# Patient Record
Sex: Female | Born: 1975 | Race: White | Hispanic: No | Marital: Married | Smoking: Never smoker
Health system: Southern US, Community
[De-identification: ages and names within clinical notes are randomized; demographics above are authoritative.]

## PROBLEM LIST (undated history)

## (undated) DIAGNOSIS — M3500 Sicca syndrome, unspecified: Secondary | ICD-10-CM

## (undated) DIAGNOSIS — M329 Systemic lupus erythematosus, unspecified: Secondary | ICD-10-CM

## (undated) DIAGNOSIS — G40109 Localization-related (focal) (partial) symptomatic epilepsy and epileptic syndromes with simple partial seizures, not intractable, without status epilepticus: Secondary | ICD-10-CM

## (undated) DIAGNOSIS — H04129 Dry eye syndrome of unspecified lacrimal gland: Secondary | ICD-10-CM

## (undated) DIAGNOSIS — F419 Anxiety disorder, unspecified: Secondary | ICD-10-CM

## (undated) DIAGNOSIS — M948X9 Other specified disorders of cartilage, unspecified sites: Secondary | ICD-10-CM

## (undated) DIAGNOSIS — IMO0002 Reserved for concepts with insufficient information to code with codable children: Secondary | ICD-10-CM

## (undated) HISTORY — DX: Dry eye syndrome of unspecified lacrimal gland: H04.129

## (undated) HISTORY — DX: Sjogren syndrome, unspecified: M35.00

## (undated) HISTORY — DX: Anxiety disorder, unspecified: F41.9

## (undated) HISTORY — DX: Other specified disorders of cartilage, unspecified sites: M94.8X9

## (undated) HISTORY — DX: Systemic lupus erythematosus, unspecified: M32.9

## (undated) HISTORY — DX: Localization-related (focal) (partial) symptomatic epilepsy and epileptic syndromes with simple partial seizures, not intractable, without status epilepticus: G40.109

## (undated) HISTORY — DX: Reserved for concepts with insufficient information to code with codable children: IMO0002

---
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
## 1999-02-10 ENCOUNTER — Other Ambulatory Visit: Admission: RE | Admit: 1999-02-10 | Discharge: 1999-02-10 | Payer: Self-pay | Admitting: Obstetrics and Gynecology

## 2000-03-11 ENCOUNTER — Other Ambulatory Visit: Admission: RE | Admit: 2000-03-11 | Discharge: 2000-03-11 | Payer: Self-pay | Admitting: Gynecology

## 2000-07-06 ENCOUNTER — Encounter: Payer: Self-pay | Admitting: Family Medicine

## 2000-07-06 ENCOUNTER — Encounter: Admission: RE | Admit: 2000-07-06 | Discharge: 2000-07-06 | Payer: Self-pay | Admitting: Family Medicine

## 2001-02-02 ENCOUNTER — Encounter: Payer: Self-pay | Admitting: Orthodontics and Dentofacial Orthopedics

## 2001-02-02 ENCOUNTER — Ambulatory Visit (HOSPITAL_COMMUNITY)
Admission: RE | Admit: 2001-02-02 | Discharge: 2001-02-02 | Payer: Self-pay | Admitting: Orthodontics and Dentofacial Orthopedics

## 2001-04-01 ENCOUNTER — Other Ambulatory Visit: Admission: RE | Admit: 2001-04-01 | Discharge: 2001-04-01 | Payer: Self-pay | Admitting: Gynecology

## 2001-12-12 ENCOUNTER — Emergency Department (HOSPITAL_COMMUNITY): Admission: EM | Admit: 2001-12-12 | Discharge: 2001-12-12 | Payer: Self-pay | Admitting: Emergency Medicine

## 2002-09-08 ENCOUNTER — Other Ambulatory Visit: Admission: RE | Admit: 2002-09-08 | Discharge: 2002-09-08 | Payer: Self-pay | Admitting: Gynecology

## 2002-12-13 ENCOUNTER — Encounter: Admission: RE | Admit: 2002-12-13 | Discharge: 2002-12-13 | Payer: Self-pay | Admitting: Gynecology

## 2002-12-13 ENCOUNTER — Encounter: Payer: Self-pay | Admitting: Gynecology

## 2003-01-27 ENCOUNTER — Inpatient Hospital Stay (HOSPITAL_COMMUNITY): Admission: AD | Admit: 2003-01-27 | Discharge: 2003-01-27 | Payer: Self-pay | Admitting: Gynecology

## 2003-01-27 ENCOUNTER — Encounter: Payer: Self-pay | Admitting: Gynecology

## 2003-03-28 ENCOUNTER — Inpatient Hospital Stay (HOSPITAL_COMMUNITY): Admission: AD | Admit: 2003-03-28 | Discharge: 2003-03-31 | Payer: Self-pay | Admitting: Gynecology

## 2003-05-14 ENCOUNTER — Other Ambulatory Visit: Admission: RE | Admit: 2003-05-14 | Discharge: 2003-05-14 | Payer: Self-pay | Admitting: Gynecology

## 2004-03-27 ENCOUNTER — Ambulatory Visit: Payer: Self-pay | Admitting: Family Medicine

## 2004-06-06 ENCOUNTER — Other Ambulatory Visit: Admission: RE | Admit: 2004-06-06 | Discharge: 2004-06-06 | Payer: Self-pay | Admitting: Obstetrics and Gynecology

## 2004-09-15 ENCOUNTER — Encounter: Admission: RE | Admit: 2004-09-15 | Discharge: 2004-09-15 | Payer: Self-pay | Admitting: Family Medicine

## 2004-09-15 ENCOUNTER — Ambulatory Visit: Payer: Self-pay | Admitting: Family Medicine

## 2004-10-29 ENCOUNTER — Ambulatory Visit: Payer: Self-pay | Admitting: Family Medicine

## 2005-06-08 ENCOUNTER — Other Ambulatory Visit: Admission: RE | Admit: 2005-06-08 | Discharge: 2005-06-08 | Payer: Self-pay | Admitting: Obstetrics and Gynecology

## 2005-12-04 ENCOUNTER — Ambulatory Visit: Payer: Self-pay | Admitting: Family Medicine

## 2005-12-17 ENCOUNTER — Ambulatory Visit: Payer: Self-pay | Admitting: Family Medicine

## 2005-12-23 ENCOUNTER — Ambulatory Visit: Payer: Self-pay | Admitting: Gastroenterology

## 2006-02-01 ENCOUNTER — Ambulatory Visit: Payer: Self-pay | Admitting: Gastroenterology

## 2006-02-08 ENCOUNTER — Ambulatory Visit: Payer: Self-pay | Admitting: Gastroenterology

## 2006-05-28 ENCOUNTER — Ambulatory Visit: Payer: Self-pay | Admitting: Family Medicine

## 2006-06-22 ENCOUNTER — Ambulatory Visit: Payer: Self-pay | Admitting: Internal Medicine

## 2006-06-22 ENCOUNTER — Inpatient Hospital Stay (HOSPITAL_COMMUNITY): Admission: AD | Admit: 2006-06-22 | Discharge: 2006-06-24 | Payer: Self-pay | Admitting: Otolaryngology

## 2006-08-02 ENCOUNTER — Ambulatory Visit (HOSPITAL_COMMUNITY): Admission: RE | Admit: 2006-08-02 | Discharge: 2006-08-02 | Payer: Self-pay | Admitting: Obstetrics and Gynecology

## 2006-08-30 ENCOUNTER — Ambulatory Visit (HOSPITAL_COMMUNITY): Admission: RE | Admit: 2006-08-30 | Discharge: 2006-08-30 | Payer: Self-pay | Admitting: Obstetrics and Gynecology

## 2006-09-29 ENCOUNTER — Ambulatory Visit (HOSPITAL_COMMUNITY): Admission: RE | Admit: 2006-09-29 | Discharge: 2006-09-29 | Payer: Self-pay | Admitting: Obstetrics and Gynecology

## 2006-10-28 ENCOUNTER — Ambulatory Visit (HOSPITAL_COMMUNITY): Admission: RE | Admit: 2006-10-28 | Discharge: 2006-10-28 | Payer: Self-pay | Admitting: Obstetrics and Gynecology

## 2006-11-23 ENCOUNTER — Ambulatory Visit (HOSPITAL_COMMUNITY): Admission: RE | Admit: 2006-11-23 | Discharge: 2006-11-23 | Payer: Self-pay | Admitting: Obstetrics and Gynecology

## 2006-12-03 ENCOUNTER — Inpatient Hospital Stay (HOSPITAL_COMMUNITY): Admission: RE | Admit: 2006-12-03 | Discharge: 2006-12-05 | Payer: Self-pay | Admitting: Obstetrics and Gynecology

## 2007-04-21 ENCOUNTER — Ambulatory Visit: Payer: Self-pay | Admitting: Family Medicine

## 2007-04-21 DIAGNOSIS — J45909 Unspecified asthma, uncomplicated: Secondary | ICD-10-CM | POA: Insufficient documentation

## 2008-11-27 DIAGNOSIS — R894 Abnormal immunological findings in specimens from other organs, systems and tissues: Secondary | ICD-10-CM | POA: Insufficient documentation

## 2009-05-17 ENCOUNTER — Encounter: Admission: RE | Admit: 2009-05-17 | Discharge: 2009-05-17 | Payer: Self-pay | Admitting: Family Medicine

## 2010-07-17 DIAGNOSIS — R5381 Other malaise: Secondary | ICD-10-CM | POA: Insufficient documentation

## 2010-07-17 DIAGNOSIS — R5383 Other fatigue: Secondary | ICD-10-CM | POA: Insufficient documentation

## 2010-07-17 DIAGNOSIS — N760 Acute vaginitis: Secondary | ICD-10-CM | POA: Insufficient documentation

## 2010-09-23 NOTE — Discharge Summary (Signed)
Meredith Pratt, Meredith Pratt NO.:  [id number]   MEDICAL RECORD NO.:  [id number]          PATIENT TYPE:  INP   LOCATION:  9126                          FACILITY:  [facility]   PHYSICIAN:  Huel Cote, M.D. DATE OF BIRTH:  04-16-1976   DATE OF ADMISSION:  12/03/2006  DATE OF DISCHARGE:  12/05/2006                               DISCHARGE SUMMARY   DISCHARGE DIAGNOSES:  1. Term pregnancy at 37-6/7 weeks, delivered.  2. 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  3. Status post normal spontaneous vaginal delivery.   DISCHARGE MEDICATIONS:  1. Motrin 600 mg p.o. every 6 hours.  2. Prednisone 10 mg p.o. q.8 h. daily.   FOLLOW UP:  The patient is to follow up with her rheumatologist in the  next few weeks and with [place] in 6 weeks for her full postpartum exam.   HISTORY OF PRESENT ILLNESS:  The patient is a 35 year old, G2, P1-0-0-1  who was admitted at 37-6/7 weeks for induction of labor given a prenatal  course significant for polychondritis syndrome and presumed diagnosis of  systemic lupus erythematosus.  She has had extensive blood work and  testing this pregnancy with very strongly positive SSA and SSB  antibodies and therefore, was followed very closely with nonstress test  and serial growth ultrasounds.  There has been no evidence of fetal  heart block on her weekly visits.  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   PRENATAL LABORATORY DATA:  O positive, antibody negative, RPR  nonreactive, rubella immune, hepatitis B surface antigen negative.  HIV  declined.  GC and chlamydia negative.  Group B Streptococcus negative.   PAST OBSTETRICAL HISTORY:  Vaginal delivery in November 2004, of a 7  pound 8 ounce infant.   PAST GYNECOLOGICAL HISTORY:  No abnormal Pap  smears.   PAST MEDICAL HISTORY:  1. Lupus, as above.  2. History of postpartum depression.   PAST SURGICAL HISTORY:  1. Appendectomy.  2. Lasix surgery.   ALLERGIES:  TETRACYCLINE.   MEDICATIONS:  Zantac and prednisone.   PHYSICAL EXAMINATION:  VITAL SIGNS:  Blood pressure on admission was  130/79, fetal heart rate reactive.  CARDIAC:  Regular rate and rhythm.  LUNGS:  Clear.  ABDOMEN:  Soft and nontender.   HOSPITAL COURSE:  The patient arrived for induction after noticing  increasing cramping and small blood clots at home.  On arrival, she had  rupture of membranes spontaneously before Pitocin was even started and  was placed on low-dose Pitocin as well.  She received stress-dose  steroids with Solu-Medrol and began to progress to 70%, 3-4 and a -2  station.  She received her epidural, progressed quickly to complete and  had normal spontaneous vaginal delivery of a vigorous female infant over a  small, second-degree laceration.  Apgar's were 8 and 9.  Weight was 6  pounds 10 ounces.  There was a nuchal cord  x1 reduced over the head.  Placenta was delivered spontaneously.  Second-degree laceration was  repaired with 2-0 Vicryl.  Cervix was intact.  She was then admitted for  routine postpartum care.  She did very well.  Her discharge hemoglobin  was 10.3.  By postpartum day #2, her pain was well-controlled.  She was  breast-feeding and bottle-feeding and had no complaints and was  continued on her prednisone 10 mg p.o. daily.      Huel Cote, M.D.  Electronically Signed     KR/MEDQ  D:  12/05/2006  T:  12/06/2006  Job:  [id number]

## 2010-09-26 NOTE — Discharge Summary (Signed)
NAMECAMBELLE, Meredith Pratt NO.:  [id number]   MEDICAL RECORD NO.:  [id number]          PATIENT TYPE:  INP   LOCATION:  5734                         FACILITY:  [facility]   PHYSICIAN:  Lucky Cowboy, MD         DATE OF BIRTH:  Sep 16, 1975   DATE OF ADMISSION:  06/22/2006  DATE OF DISCHARGE:  06/24/2006                               DISCHARGE SUMMARY   DISCHARGE DIAGNOSIS:  Bilateral perichondritis with cellulitis.   HOSPITAL COURSE:  This patient is a 35 year old female with a 7-day  history of bilateral pinna swelling and a 14-day history of ear pain.  There has been no improvement despite 6 doses of Bactrim and  prior to  this Keflex.  She is pregnant and on prenatal vitamins.  She was  admitted for IV antibiotic therapy and rheumatology consultation.  Rheumatology consultation was obtained by Dr. Vicki Mallet.  There was  some concern that the ears were no longer painful, just remained quite  swollen.  Multiple rheumatologic markers were obtained.  These  laboratory studies returned a positive rheumatoid factor with an  elevated sedimentation rate of 56.  The rheumatoid factor was 226 with a  normal being 0-20.  The concern was the probability of relapsing  polychondritis.  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  She was started on prednisone 20 mg 1  b.i.d. at discharge.  Is to follow up with Dr. Kellie Simmering.  She is also  taking calcium and vitamin D.  During her admission, she was also  evaluated by Infectious Disease.  They were also thinking most likely  inflammation but no definite infection.  Antibiotics were stopped by the  time of discharge.      Lucky Cowboy, MD  Electronically Signed     SJ/MEDQ  D:  08/20/2006  T:  08/20/2006  Job:  [id number]   cc:   Ginette Otto EAR, NOSE, AND THROAT  Huel Cote, M.D.  Aundra Dubin, M.D.

## 2010-09-26 NOTE — H&P (Signed)
NAME:  Meredith Pratt, Meredith Pratt                          ACCOUNT NO.:  [id number]   MEDICAL RECORD NO.:  [id number]                   PATIENT TYPE:  MAT   LOCATION:  MATC                                 [facility]ACILITY:  [facility]   PHYSICIAN:  Timothy P. Fontaine, M.D.           DATE OF BIRTH:  [birth year]   DATE OF ADMISSION:  01/27/2003  DATE OF DISCHARGE:                                HISTORY & PHYSICAL   CHIEF COMPLAINT:  Suprapubic pressure, pain.   HISTORY OF PRESENT ILLNESS:  A 35 year old G1, P0 female at [redacted] weeks  gestation who presents having awoken this morning with suprapubic pressure  and pain.  The patient was doing well up until this time.  She went to bed  last night without complications and then awoke with this pain.  When she  stood up she notes the pain was severe enough to buckle her over.  She did  not think that it was contractions.  She has no vaginal bleeding, rupture of  membranes, discharge, changes.  She has no precipitating event such as  trauma or recent intercourse.  Her prenatal course has otherwise been  uncomplicated.   PAST SURGICAL HISTORY:  Uncomplicated.   PAST MEDICAL HISTORY:  Uncomplicated.   ALLERGIES:  None.   REVIEW OF SYSTEMS:  Noncontributory.   SOCIAL HISTORY:  Noncontributory.   ADMISSION PHYSICAL EXAMINATION:  VITAL SIGNS:  Afebrile, vital signs are  stable.  HEENT:  Normal.  LUNGS:  Clear.  CARDIAC:  Regular rate without rubs, murmurs or gallops.  ABDOMEN:  Uterus is appropriate for dates.  She has subjective tenderness  suprapubically with pressure, no objective findings.  Her external monitor  shows a reactive fetus without regular contractions.  PELVIC:  Shows a normal discharge.  Her cervix is long and closed.  No  evidence of bleeding or rupture of membranes.   LABORATORY DATA:  UA initial cath specimen shows several red cells, but  otherwise unremarkable.  Subsequent clean-catch shows some blood without  significant bacteruria or  leukocytosis.  Ultrasound evaluation shows  anterior normal placenta without evidence of abruptio or previa.  Her cervix  is 3+ centimeters and closed.   ASSESSMENT AND PLAN:  Initial plan was to intravenously hydrate and to  monitor.  The patient did not want the IV and preferred p.o. fluid  hydration.  With p.o. fluid hydration she noted that her symptoms seemed to  improve, they did not totally disappear, but they improved, and on extended  monitoring she showed no evidence of contractions and had a reactive fetus.  The initial plan was to discharge her home to rest and fluid hydration, to  check a urine culture to rule out a urinary tract infection.  The patient  did note some blood in her urine after discharge, per phone call, although  discussed with the patient this more likely associated with the catheter.  She was  still having  some suprapubic pressure and we decided to go ahead and treat  her as a low grade urinary tract infection with Macrobid 100 b.i.d. x7 days.  Preterm labor precautions were reviewed with her and she is to follow up in  the office the week of discharge, assuming she remains without increasing  pain or any other symptoms.                                                Timothy P. Audie Box, M.D.    TPF/MEDQ  D:  01/27/2003  T:  01/27/2003  Job:  [id number]

## 2010-09-26 NOTE — Assessment & Plan Note (Signed)
[facility]                           GASTROENTEROLOGY OFFICE NOTE   NAME:JOHNSONKyoko, Elsea                       MRN:          [id number]  DATE:02/01/2006                            DOB:          [birth year]    PRIMARY CARE PHYSICIAN:  Dr. Kelle Darting   GI PROBLEM LIST:  Alternating irritable bowel symptoms.  Intermittent rectal  bleeding around times of constipation.  CBC normal, CMET normal.  Fiber  supplements have helped.   CURRENT MEDICINES:  Effexor, Citrucel daily.   PHYSICAL EXAMINATION:  VITAL SIGNS:  Weight 142 pounds, blood pressure  112/68, pulse 64.  CONSTITUTIONAL:  Well-appearing.  ABDOMEN:  Soft, nontender, nondistended, normal bowel sounds.   ASSESSMENT AND PLAN:  A 35 year old woman with improving bowel habits since  beginning fiber supplement.  Mild intermittent rectal bleeding.   It is most probable that her mild intermittent rectal bleeding is  hemorrhoidal related.  She says it started around the time of pregnancy,  happens usually around the time of constipation.  That being said, colon  cancer is not completely unheard of at her age and I have personally seen  young patients with it.  I think to be safest, a sigmoidoscopy or full  colposcopy is a reasonable next step.  She has agreed and will undergo full  colonoscopy at her soonest convenience.  Her symptoms are improving with  fiber supplementation.  She will continue doing that and I will discuss how  she is doing at the time of her colonoscopy.                                   Rachael Fee, MD   DPJ/MedQ  DD:  02/01/2006  DT:  02/03/2006  Job #:  [id number]   cc:   Tinnie Gens A. Tawanna Cooler, MD

## 2010-09-26 NOTE — Discharge Summary (Signed)
NAME:  Meredith Pratt, Meredith Pratt                          ACCOUNT NO.:  [id number]   MEDICAL RECORD NO.:  [id number]                   PATIENT TYPE:  INP   LOCATION:  9102                                 [facility]ACILITY[facility]   PHYSICIAN:  Juan H. Lily Peer, M.D.             DATE OF BIRTH:  [birth year]   DATE OF ADMISSION:  03/28/2003  DATE OF DISCHARGE:  03/31/2003                                 DISCHARGE SUMMARY   DISCHARGE DIAGNOSES:  1. Intrauterine pregnancy at 39+ weeks, delivered.  2. Borderline low amniotic fluid on ultrasound.  3. Positive group B Strep.  4. Status post spontaneous vaginal delivery.   HISTORY:  This is a 35 year old female gravida 1, para 0 with an [facility]  March 31, 2003.  Prenatal course had been complicated by right axillary  mass, also with positive group B Strep.  On the date of admission was found  to have borderline low AFI of 8.2.  Therefore, patient was admitted for  induction.   HOSPITAL COURSE:  On March 28, 2003 patient was admitted, given Cervidil  at 39-1/2 weeks.  On March 29, 2003 Pitocin was started.  It was noticed  after artificial rupture of membranes there was clear to minimal fluid.  Fetal heart rate decreased and blood pressure 67/30.  The patient was given  10 mg of ephedrine, lateral positioning, O2 and Pitocin was discontinued.  Amnioinfusion was begun and baseline returned back to normal.  Pitocin was  held for 30-40 minutes to allow baby to compensate before restarting and  subsequently on March 29, 2003 patient underwent a spontaneous vaginal  delivery of a female, Apgars of 9 and 9, weight of 7 pounds 8 ounces.  There  was a midline episiotomy.  Placenta was partially manually removed.  Second  degree was repaired without complications.  The patient had been given  penicillin G IV for group B Strep prophylaxis in labor.  Postpartum patient  remained afebrile, voiding, stable condition.  She was discharged to home  March 31, 2003 and  given [facility] postpartum instructions.   ACCESSORY CLINICAL FINDINGS:  Laboratories:  The patient is O+.  Rubella  immune.  On March 30, 2003 hemoglobin 10.7.   DISPOSITION:  The patient was discharged to home.  Iron on a daily basis.  The patient was reminded needs follow-up of the right axillary mass if  persisted at the postpartum visit.     Susa Loffler, P.A.                    Juan H. Lily Peer, M.D.    TSG/MEDQ  D:  04/30/2003  T:  04/30/2003  Job:  [id number]

## 2010-09-26 NOTE — Assessment & Plan Note (Signed)
Meredith Pratt                           GASTROENTEROLOGY OFFICE NOTE   Meredith Pratt, Meredith Pratt                         MRN:          [id number]  DATE:12/23/2005                            DOB:          July 12, 1975    REFERRING PHYSICIAN:  Tinnie Gens A. Tawanna Cooler, MD   REASON FOR CONSULTATION:  Dr. Eugenio Hoes. Todd asked me to evaluate the  patient in consultation regarding irritable bowel symptoms.   HISTORY OF PRESENT ILLNESS:  The patient is a very pleasant 35 year old  woman who has had troubles with bowels since she was in her late teens.  She  describes alternating constipation and diarrhea.  When she does have  diarrhea it is always trigger by a meal but she is not been able to figure  exactly what food causes it.  She will usually just finish her meal have  cramping urgency to defecate.  She will get sweaty and clammy and once or  twice she has had fecal incontinence but usually she makes it to the  bathroom just in time.  She has traumatic diarrhea two to three episodes.  After that she will often not move her bowels for two to three days,  sometimes she will have very hard __________ stools.  She has seen very  minor rectal bleeding on the tissue paper with straining for bowel  movements.  She has no nocturnal symptoms.  She has tried elimination diets  with dairy.  She noticed no difference been avoiding daily.  She had recent  lab tests performed that were essentially normal including a normal CBC,  normal complete metabolic profile.   REVIEW OF SYSTEMS:  Notable for no extraintestinal symptoms of inflammatory  bowel disease, stable weight.  The rest of the review of systems is  essentially normal and is available on the nursing intake sheet.   PAST MEDICAL HISTORY:  Heart murmur closed in 1993, postpartum depression  2004, status post appendectomy 1994.  She tells me that during surgery the  surgeon found that her appendix was normal but she have some  inflammation in  her bowel near the appendix that caused her symptoms.  She was not told that  she had Crohn's disease.  She was at that time was sent to see a specialist  and he apparently did not think too much of things and she had not been  followed up since then.   CURRENT MEDICATIONS:  Effexor 75 mg once daily.   ALLERGIES:  No known drug allergies.   SOCIAL HISTORY:  Married with one 67 year old daughter. 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  Nonsmoker.  Drinks very occasionally.   FAMILY HISTORY:  Father with heart disease.  No colon cancer or colon polyps  in family.   PHYSICAL EXAMINATION:  GENERAL APPEARANCE:  5-feet 6-inches, 144 pounds.  VITAL SIGNS:  Blood pressure 104/50, pulse 72.  CONSULTATIONS:  Generally well appearing.  NEUROLOGICAL:  Alert and oriented times three.  HEENT:  Eyes - extraocular movements are intact.  Mouth - oropharynx moist.  No lesion.  NECK:  Supple.  No lymphadenopathy.  CARDIOVASCULAR:  Heart regular rate and rhythm.  LUNGS:  Clear to auscultation bilaterally.  ABDOMEN:  Soft, non tender, nondistended.  Normal bowel sounds.  EXTREMITIES:  No extremity edema.  SKIN:  No rash or lesions on the visible extremities.   ASSESSMENT AND PLAN:  A 35 year old woman with alternating irritable bowel  symptoms.   She has had very minor rectal bleeding but is not anemic and the rectal  bleeding was generally associated with strain to move her bowels which I  think this probably just hemorrhoidal.  I would like to keep that in mind  and we will consider flexible sigmoidoscopy in the future.  Her symptoms  have been long standing and likely are related to irritable bowel.  She will  try Citracal supplementation on a daily basis to see if we can help her  constipation and maybe firm up her stools so she does not have this dramatic  highs and lows like she has been doing.  She will also keep a food diary to  see if we can pin point any certain food that may  be contributing.  Lastly  she will return to see me in four weeks time to go over her symptoms again  and we will proceed from there.                                   Rachael Fee, MD   DPJ/MedQ  DD:  12/23/2005  DT:  12/23/2005  Job #:  [id number]   cc:   Corwin Levins, MD

## 2010-09-26 NOTE — H&P (Signed)
NAME:  Meredith Pratt, Meredith Pratt                          ACCOUNT NO.:  [id number]   MEDICAL RECORD NO.:  [id number]                   PATIENT TYPE:   LOCATION:                                       FACILITY:   PHYSICIAN:  Ivor Costa. Farrel Gobble, M.D.              DATE OF BIRTH:  01-29-1976   DATE OF ADMISSION:  03/28/2003  DATE OF DISCHARGE:                                HISTORY & PHYSICAL   HISTORY OF PRESENT ILLNESS:  The patient is a 35 year old G1 with an  estimated date of confinement of March 31, 2003 based on a first-  trimester ultrasound at seven weeks with an estimated gestational age of  39.5 weeks who presents today for an elective induction secondary to low-  normal AFI.  Her pregnancy has only been complicated by positive group B  strep.  She reports good fetal movement, no vaginal bleeding, and no  contractions.  Her blood type is O positive, antibody negative, RPR  nonreactive, rubella immune, hepatitis B surface antigen nonreactive, HIV  nonreactive, AFP within normal limits, and GBS positive.  Please refer to  the [facility].   PHYSICAL EXAMINATION:  GENERAL:  She is a well-appearing gravida in no acute  distress.  HEART:  Regular rate.  LUNGS:  Clear to auscultation.  ABDOMEN:  Gravid with a fundal height of 35.  Heart tones were auscultated.  PELVIC:  Vaginal exam was short, closed, posterior, and -2.   The patient had an ultrasound done because of the decreased fundal height.  Her estimated size was appropriate at 3700 grams.  However, AFI was 8.2.  Based on that we elected to go ahead and induce her labor.  Risks and  benefits were discussed with the patient and accepted and she will present  on March 28, 2003 for induction.                                               Ivor Costa. Farrel Gobble, M.D.    THL/MEDQ  D:  03/28/2003  T:  03/28/2003  Job:  [id number]

## 2011-02-23 DIAGNOSIS — R21 Rash and other nonspecific skin eruption: Secondary | ICD-10-CM | POA: Insufficient documentation

## 2011-02-23 LAB — CBC
HCT: 29 — ABNORMAL LOW
HCT: 32 — ABNORMAL LOW
MCHC: 35.7
MCV: 95.8
Platelets: 269
Platelets: 283
RDW: 13.5
RDW: 13.5
WBC: 13.8 — ABNORMAL HIGH

## 2011-05-18 ENCOUNTER — Other Ambulatory Visit: Payer: Self-pay | Admitting: Family Medicine

## 2011-05-18 DIAGNOSIS — Z1231 Encounter for screening mammogram for malignant neoplasm of breast: Secondary | ICD-10-CM

## 2011-05-20 ENCOUNTER — Ambulatory Visit
Admission: RE | Admit: 2011-05-20 | Discharge: 2011-05-20 | Disposition: A | Discharge status: Home / Self Care | Payer: BC Managed Care – PPO | Source: Ambulatory Visit | Attending: Family Medicine | Admitting: Family Medicine

## 2011-05-20 DIAGNOSIS — Z1231 Encounter for screening mammogram for malignant neoplasm of breast: Secondary | ICD-10-CM

## 2012-03-17 DIAGNOSIS — R0789 Other chest pain: Secondary | ICD-10-CM | POA: Insufficient documentation

## 2012-04-18 DIAGNOSIS — E559 Vitamin D deficiency, unspecified: Secondary | ICD-10-CM | POA: Insufficient documentation

## 2012-04-18 DIAGNOSIS — J329 Chronic sinusitis, unspecified: Secondary | ICD-10-CM | POA: Insufficient documentation

## 2012-05-31 ENCOUNTER — Other Ambulatory Visit: Payer: Self-pay | Admitting: Obstetrics and Gynecology

## 2012-05-31 DIAGNOSIS — Z1231 Encounter for screening mammogram for malignant neoplasm of breast: Secondary | ICD-10-CM

## 2012-06-24 ENCOUNTER — Ambulatory Visit: Payer: BC Managed Care – PPO

## 2012-07-15 ENCOUNTER — Ambulatory Visit: Payer: BC Managed Care – PPO

## 2012-08-01 ENCOUNTER — Ambulatory Visit
Admission: RE | Admit: 2012-08-01 | Discharge: 2012-08-01 | Disposition: A | Discharge status: Home / Self Care | Payer: BC Managed Care – PPO | Source: Ambulatory Visit | Attending: Obstetrics and Gynecology | Admitting: Obstetrics and Gynecology

## 2012-08-01 DIAGNOSIS — Z1231 Encounter for screening mammogram for malignant neoplasm of breast: Secondary | ICD-10-CM

## 2014-03-08 ENCOUNTER — Encounter: Payer: Self-pay | Admitting: Neurology

## 2014-03-08 ENCOUNTER — Ambulatory Visit (INDEPENDENT_AMBULATORY_CARE_PROVIDER_SITE_OTHER): Payer: BC Managed Care – PPO | Admitting: Neurology

## 2014-03-08 VITALS — BP 103/73 | HR 75 | Temp 97.6°F | Ht 67.0 in | Wt 156.0 lb

## 2014-03-08 DIAGNOSIS — M3500 Sicca syndrome, unspecified: Secondary | ICD-10-CM | POA: Insufficient documentation

## 2014-03-08 DIAGNOSIS — M3219 Other organ or system involvement in systemic lupus erythematosus: Secondary | ICD-10-CM

## 2014-03-08 DIAGNOSIS — H04129 Dry eye syndrome of unspecified lacrimal gland: Secondary | ICD-10-CM | POA: Insufficient documentation

## 2014-03-08 DIAGNOSIS — M948X9 Other specified disorders of cartilage, unspecified sites: Secondary | ICD-10-CM | POA: Insufficient documentation

## 2014-03-08 DIAGNOSIS — IMO0002 Reserved for concepts with insufficient information to code with codable children: Secondary | ICD-10-CM | POA: Insufficient documentation

## 2014-03-08 DIAGNOSIS — M329 Systemic lupus erythematosus, unspecified: Secondary | ICD-10-CM | POA: Insufficient documentation

## 2014-03-08 DIAGNOSIS — F419 Anxiety disorder, unspecified: Secondary | ICD-10-CM | POA: Insufficient documentation

## 2014-03-08 NOTE — Progress Notes (Signed)
[facility]    Provider:  Dr Lucia GaskinsAhern Referring Provider: Roderick Peeodd, Jeffrey A, MD Primary Care Physician:  Maryelizabeth RowanEWEY,ELIZABETH, MD  CC:  Cognitive changes  HPI:  Meredith Pratt is a 38 y.o. female here as a referral from Dr. Tawanna Coolerodd for Cognitive changes. She has a PMHx of Lupus and Sjogrens. Past few months she is having moments where she is having difficulty remembering how to do things. She forgot how to write a check and had to pay with debit. She put her shoes on and realized she forgot her pants. She forgets to turn the car engine off, forgetting lots of objects. She has hand tremors that are increasing. Word-finding difficulties. She has stress but it is more than that. No dysarthria, no vision changes, no headache, no focal weakness, CP, SOB. She is a Runner, broadcasting/film/videoteacher, she has been very functional in the past, this has never happened to her. She feels stupid. She is extremely disturbed, causing significant stress and worry. Forgets tasks at work. Not feeling sad, she does endorse anxiety. Going on for at least several months, becoming more and more apparent and severe - and dangerous. Also tremors worsening. No LOC. No Dizziness. Episodic hand numbness. No fever.    Review of Systems: Patient complains of symptoms per HPI as well as the following symptoms fatigue, joint pain, aching muscles, skin sensitivity, anxiety, tremors, memory loss. Pertinent negatives per HPI. All others negative.   History   Social History  . Marital Status: Married    Spouse Name: N/A    Number of Children: N/A  . Years of Education: N/A   Occupational History  . Not on file.   Social History Main Topics  . Smoking status: Never Smoker   . Smokeless tobacco: Never Used  . Alcohol Use: Not on file  . Drug Use: Not on file  . Sexual Activity: Not on file   Other Topics Concern  . Not on file   Social History Narrative  . No narrative on file    Family History  Problem Relation Age of Onset    . Glaucoma Mother   . Diabetes type II Mother   . Prostate cancer Father   . Coronary artery disease Father     Past Medical History  Diagnosis Date  . Sjogren's syndrome   . Lupus   . Dry eye syndrome   . Polychondritis   . Anxiety     Past Surgical History  Procedure Laterality Date  . Appendectomy  1994    Current Outpatient Prescriptions  Medication Sig Dispense Refill  . azaTHIOprine (IMURAN) 50 MG tablet Take 50 mg by mouth 2 (two) times daily.      Marland Kitchen. escitalopram (LEXAPRO) 10 MG tablet Take 10 mg by mouth daily.      . predniSONE (DELTASONE) 10 MG tablet Take 10 mg by mouth daily with breakfast.       No current facility-administered medications for this visit.    Allergies as of 03/08/2014 - Review Complete 03/08/2014  Allergen Reaction Noted  . Tetracyclines & related Hives and Nausea And Vomiting 03/08/2014    Vitals: BP 103/73  Pulse 75  Temp(Src) 97.6 F (36.4 C) (Oral)  Ht 5\' 7"  (1.702 m)  Wt 156 lb (70.761 kg)  BMI 24.43 kg/m2 Last Weight:  Wt Readings from Last 1 Encounters:  03/08/14 156 lb (70.761 kg)   Last Height:   Ht Readings from Last 1 Encounters:  03/08/14 5\' 7"  (1.702 m)  Physical exam: Exam: Gen: NAD, conversant, well nourised, well groomed                     CV: RRR, no MRG. No Carotid Bruits. No peripheral edema, warm, nontender Eyes: Conjunctivae clear without exudates or hemorrhage  Neuro: Detailed Neurologic Exam  Speech:    Speech is normal; fluent and spontaneous with normal comprehension.  Cognition:    The patient is oriented to person, place, and time;     recent and remote memory intact;     language fluent;     normal attention, concentration,     fund of knowledge Cranial Nerves:    The pupils are equal, round, and reactive to light. The fundi are normal and spontaneous venous pulsations are present. Visual fields are full to finger confrontation. Extraocular movements are intact. Trigeminal sensation is  intact and the muscles of mastication are normal. The face is symmetric. The palate elevates in the midline. Voice is normal. Shoulder shrug is normal. The tongue has normal motion without fasciculations.   Coordination:    Normal finger to nose and heel to shin.  Gait:    Heel-toe and tandem gait are normal.   Motor Observation:    No asymmetry, no atrophy, and no involuntary movements noted. Tone:    Normal muscle tone.    Posture:    Posture is normal. normal erect    Strength:    Strength is V/V in the upper and lower limbs.      Sensation: intact to LT     Reflex Exam: Could not evaluate left foot due to boot and tendonitis  DTR's:    Deep tendon reflexes in the upper and lower extremities are normal bilaterally.   Toes:    The toes are downgoing bilaterally.   Clonus:    Clonus is absent.    Assessment/Plan:  38 year old female with a PMHx of Connective Tissue Disease who is here for evaluation of progressive cognitive dysfunction, She has hand tremors that are increasing. Word-finding difficulties. Neuro exam is non focal however CNS complications of connective tissue disease can include seizures, strokes and other CNS complications so need to order an MRI of the brain and EEG. After results will follow up with the patient and see her clinical progression before ordering further evaluation.   Naomie DeanAntonia Ahern, MD  [facility] [address] [place], [place] [id number]  Phone [phone] Fax [phone]    Achilles tendonitis on the left foot.

## 2014-03-08 NOTE — Patient Instructions (Signed)
Overall you are doing fairly well but I do want to suggest a few things today:   Remember to drink plenty of fluid, eat healthy meals and do not skip any meals. Try to eat protein with a every meal and eat a healthy snack such as fruit or nuts in between meals. Try to keep a regular sleep-wake schedule and try to exercise daily, particularly in the form of walking, 20-30 minutes a day, if you can.   As far as diagnostic testing: MRI of the brain, EEG  I would like to see you back in 3 months, sooner if we need to. Please call us with any interim questions, concerns, problems, updates or refill requests.   Please also call us for any test results so we can go over those with you on the phone.  My clinical assistant and will answer any of your questions and relay your messages to me and also relay most of my messages to you.   Our phone number is [phone]. We also have an after hours call service for urgent matters and there is a physician on-call for urgent questions. For any emergencies you know to call 911 or go to the nearest emergency room   

## 2014-03-14 ENCOUNTER — Ambulatory Visit (INDEPENDENT_AMBULATORY_CARE_PROVIDER_SITE_OTHER): Payer: BC Managed Care – PPO

## 2014-03-14 ENCOUNTER — Ambulatory Visit (INDEPENDENT_AMBULATORY_CARE_PROVIDER_SITE_OTHER): Payer: BC Managed Care – PPO | Admitting: Radiology

## 2014-03-14 DIAGNOSIS — M3219 Other organ or system involvement in systemic lupus erythematosus: Secondary | ICD-10-CM

## 2014-03-14 NOTE — Procedures (Signed)
       EEG REPORT  Meredith Pratt is a 38 year old patient with a history of lupus and Sjogren's syndrome with a recent history of cognitive dysfunction associated with confusion. The patient is being evaluated for this progressive change in mental status.  This is a routine EEG. No skull defects are noted. Medications include Imuran, Lexapro, and prednisone.  EEG classification: Dysrhythmia grade 2 left greater than right temporal  Description of the recording: The background rhythms of this recording consists of a well modulated medium amplitude alpha rhythm of 9 Hz that is reactive to eye opening and closure. As the record progresses, photic stimulation is performed, and results in a bilateral and symmetric photic driving response. Hyperventilation is also performed, resulting in a minimal buildup of the background rhythm activities with slight symmetric slowing seen. Intermittently during the recording, dysrhythmic theta activity is seen emanating from the temporal regions, and is oftentimes asymmetric, and may be isolated to the left side. Occasionally, sharp transients are associated with this dysrhythmic slowing. At no time during the recording does there appear to be evidence of spike or spike wave discharges. EKG monitor shows no evidence of cardiac rhythm abnormalities with a heart rate of 66.  Impression: This is an abnormal EEG recording secondary to dysrhythmic theta slowing seen from the temporal regions bilaterally, left greater than right. This study suggests bihemispheric dysfunction, and suggests a lowered seizure threshold, although no electrographic seizures were recorded.

## 2014-03-19 ENCOUNTER — Telehealth: Payer: Self-pay | Admitting: Neurology

## 2014-03-19 MED ORDER — GADOPENTETATE DIMEGLUMINE 469.01 MG/ML IV SOLN: 14.0000 mL | Freq: Once | INTRAVENOUS | Status: AC | PRN

## 2014-03-19 NOTE — Telephone Encounter (Signed)
Spoke to patient today. Discussed her EEG results. Should repeat EEG and also lumbar puncture. Discussed with patient. She had another episode today. Also MRI says results 11/8 however I can't see the read. Will follow up

## 2014-03-19 NOTE — Telephone Encounter (Signed)
Patient calling to request EEG and MRI test results as well as the next steps, please return call and advise.

## 2014-03-19 NOTE — Telephone Encounter (Signed)
Spoke to patient in detail about EEG and next steps, see previous phone note. MRI of the brain was normal, left message on voice mail.

## 2014-03-21 ENCOUNTER — Other Ambulatory Visit: Payer: Self-pay | Admitting: Neurology

## 2014-03-22 ENCOUNTER — Telehealth: Payer: Self-pay | Admitting: Neurology

## 2014-03-22 ENCOUNTER — Other Ambulatory Visit: Payer: Self-pay | Admitting: Neurology

## 2014-03-22 MED ORDER — LACOSAMIDE 150 MG PO TABS: 150.0000 mg | ORAL_TABLET | Freq: Two times a day (BID) | ORAL | Status: DC

## 2014-03-22 MED ORDER — LACOSAMIDE 100 MG PO TABS: 100.0000 mg | ORAL_TABLET | Freq: Two times a day (BID) | ORAL | Status: DC

## 2014-03-22 NOTE — Telephone Encounter (Signed)
Patient's clinical symptoms and EEG with asymmetric slowing seen from the temporal regions bilaterally is consistent with complex partial seizures. Spoke to her rheumatologist today, Dr. Dierdre ForthBeekman, and will treat with anti-epileptics. At this time we agreed there is no need to perform LP for evaluation of CNS lupus. Have discussed with patient. Also advised patient not to drive until seizure free for 6 months or per [place] laws. Also advised not to bathe or swim alone or do anything that may cause harm to herself or others should she have a seizure. Patient acknowledged understanding.

## 2014-04-08 ENCOUNTER — Other Ambulatory Visit: Payer: Self-pay | Admitting: Neurology

## 2014-04-08 ENCOUNTER — Telehealth: Payer: Self-pay | Admitting: Neurology

## 2014-04-08 MED ORDER — CARBAMAZEPINE ER 200 MG PO TB12: 200.0000 mg | ORAL_TABLET | Freq: Two times a day (BID) | ORAL | Status: DC

## 2014-04-08 NOTE — Telephone Encounter (Signed)
Patient is experiencing continued side effects from Vimpat. She was on 100mg  and then increased to 150mg  bid. See below for details. She has discontinued the Vimapt on her own. Will try Tegretol XR instead for her complex partial seizures, will start at 200mg  BID and she will follow up with me by the end of the week.    Email from patient regarding Vimpat:  I have nausea, fatigue that have really taken me down. They have increased with each day. I have tried to take the medicine before eating as well as after eating to see if that made a difference. It actually doesn't make a difference but the nausea its continuous throughout the day. I have had headaches and increased urine output in addition to the above side effects.  I am not sure but I think I have actually seen an increase in seizures. I have seen continued issues with withdrawing information/word retrieval, visual issues, sensation of my hands being large/swollen (but they are not) pain sensations in my hand ( brief), muscle issues after working out  My concern is the activity that has continued....of course these seizure symptoms are all ones I have had for several months but didn't know that is what they were....after what you have told me and what I have read I probably was having more than I realized. When I was on the 100mg  I did see some of those go away but when I went to 150 they came right back and possibly more frequently.

## 2014-04-13 ENCOUNTER — Telehealth: Payer: Self-pay | Admitting: Neurology

## 2014-04-13 ENCOUNTER — Other Ambulatory Visit: Payer: Self-pay | Admitting: Neurology

## 2014-04-13 MED ORDER — CLONAZEPAM 1 MG PO TABS: ORAL_TABLET | ORAL | Status: DC

## 2014-04-13 MED ORDER — LEVETIRACETAM 500 MG PO TABS: 500.0000 mg | ORAL_TABLET | Freq: Two times a day (BID) | ORAL | Status: AC

## 2014-04-13 NOTE — Telephone Encounter (Signed)
Patient stated feeling very dizzy and tired from increased dosage of Rx carbamazepine (TEGRETOL-XR) 200 MG 12 hr tablet.  Please call and advise.

## 2014-04-13 NOTE — Telephone Encounter (Signed)
She did not tolerate the Vimpat(see previous phone notes). She was started on Tegretol and she is so dizzy that she had to "crawl to the bed" because she was so dizzy after taking the Tegretol. Will try a third medication, Keppra. She is still driving, stronly advised her not to drive and let her know(again) that per [place] laws she is not supposed to drive until seizure free for 6 months. She told me that she knows.  I think there is significant overlapping anxiety as well, she is interpreting everything as a possible seizure (she felt her toes crossing but looked down and they were not crossed). Tried to reassure patient again. She would like to an epilepsy fellowship trained neurologist and I have discussed with Dr. Vickey Pratt and will ask her assistant to schedule Meredith Pratt. In the meantime will also start 1mg  clonazepam twice daily with the Keppra 500mg  twice daily.

## 2014-04-16 ENCOUNTER — Encounter: Payer: Self-pay | Admitting: Neurology

## 2014-04-16 ENCOUNTER — Ambulatory Visit (INDEPENDENT_AMBULATORY_CARE_PROVIDER_SITE_OTHER): Payer: BC Managed Care – PPO | Admitting: Neurology

## 2014-04-16 VITALS — BP 111/79 | HR 102 | Ht 67.0 in | Wt 149.0 lb

## 2014-04-16 DIAGNOSIS — F411 Generalized anxiety disorder: Secondary | ICD-10-CM

## 2014-04-16 DIAGNOSIS — R42 Dizziness and giddiness: Secondary | ICD-10-CM

## 2014-04-16 DIAGNOSIS — R569 Unspecified convulsions: Secondary | ICD-10-CM

## 2014-04-16 NOTE — Progress Notes (Signed)
[facility]    Provider:  Dr Lucia GaskinsAhern Referring Provider: Maryelizabeth Rowanewey, Elizabeth, MD Primary Care Physician:  Maryelizabeth RowanEWEY,ELIZABETH, MD  CC:  Follow up for seizures  HPI:  Meredith Pratt is a 38 y.o. female here as a follo wup for seizures.  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 She was originally seen on 03/08/2014. At that time she described symptoms c/w complex partial seizures. EEG showed dysrhythmic theta slowing seen from the temporal regions  bilaterally, left greater than right. She was started on Vimpat and she was feeling better on 100mg  bid but was having breakthrough seizures and then was increased to 150mg  but could not tolerate the medication and was discontinued. Tegretol XR was tried and patient could no tolerate so it was discontinued. She was started on Keppra with clonazepam and is not doing well. She is accompanied by her husband.  She can't withdraw information from her brain. Randomly her hand jumps. Her right hand jumps, jerks. She feels like her hands are swelling and they feel cartoon-like but when she looks at them they are normal. She feels pain in her hand then it just goes away. She is screaming one minute and then crying the next. Husband is here with her. She is crying in the office. She is frustrated. She started Keppra and clonazepam. She had very bad side effects from the tegretol and the vimapt. She was dizzy. She is extremely anxious. She is yelling at her husband, very emotionally labile. She has slept all weekend. She is extremely worried that we are missing something. She has always been very sensitive to all medications, this has happened to her multiple times where she had to stop or wean off medications due to side effects.   Reviewed notes, labs and imaging from outside physicians. EEG 03/14/2014 This is an abnormal EEG recording secondary to  dysrhythmic theta slowing seen from the  temporal regions  bilaterally, left greater than right. This study suggests  bihemispheric dysfunction, and suggests a lowered seizure  threshold, although no electrographic seizures were recorded.  MRI of the brain 03/14/2014 was normal   Review of Systems: Patient complains of symptoms per HPI as well as the following symptoms: blurred vision, memory loss, dizziness, speech dificulty, weakness, walking difficulty, daytime sleepiness. Pertinent negatives per HPI. All others negative.  Initial appointment 10/29: HPI: Meredith Ruskrin M Adriance is a 38 y.o. female here as a referral from Dr. Tawanna Coolerodd for Cognitive changes. She has a PMHx of Lupus and Sjogrens. Past few months she is having moments where she is having difficulty remembering how to do things. She forgot how to write a check and had to pay with debit. She put her shoes on and realized she forgot her pants. She forgets to turn the car engine off, forgetting lots of objects. She has hand tremors that are increasing. Word-finding difficulties. She has stress but it is more than that. No dysarthria, no vision changes, no headache, no focal weakness, CP, SOB. She is a Runner, broadcasting/film/videoteacher, she has been very functional in the past, this has never happened to her. She feels stupid. She is extremely disturbed, causing significant stress and worry. Forgets tasks at work. Not feeling sad, she does endorse anxiety. Going on for at least several months, becoming more and more apparent and severe - and dangerous. Also tremors worsening. No LOC. No Dizziness. Episodic hand numbness. No fever.   History  Social History  . Marital Status: Married    Spouse Name: N/A    Number of Children: 2  . Years of Education: 12+   Occupational History  . Not on file.   Social History Main Topics  . Smoking status: Never Smoker   . Smokeless tobacco: Never Used  . Alcohol Use: 0.0 oz/week    0 Not specified per week     Comment: social  . Drug Use: No  . Sexual Activity: Not on  file   Other Topics Concern  . Not on file   Social History Narrative   Patient is married   Patient has 2 children    Patient has a Master's degree    Caffeine 1 cup daily average    Family History  Problem Relation Age of Onset  . Glaucoma Mother   . Diabetes type II Mother   . Prostate cancer Father   . Coronary artery disease Father     Past Medical History  Diagnosis Date  . Sjogren's syndrome   . Lupus   . Dry eye syndrome   . Polychondritis   . Anxiety   . 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 Appendectomy  1994    Current Outpatient Prescriptions  Medication Sig Dispense Refill  . azaTHIOprine (IMURAN) 50 MG tablet Take 50 mg by mouth 2 (two) times daily.    Marland Kitchen escitalopram (LEXAPRO) 10 MG tablet Take 10 mg by mouth daily.    Marland Kitchen levETIRAcetam (KEPPRA) 500 MG tablet Take 1 tablet (500 mg total) by mouth 2 (two) times daily. 60 tablet 11  . predniSONE (DELTASONE) 10 MG tablet Take 10 mg by mouth as needed.     . phenytoin (DILANTIN) 100 MG ER capsule Take 3 capsules (300 mg total) by mouth at bedtime. 90 capsule 0   No current facility-administered medications for this visit.    Allergies as of 04/16/2014 - Review Complete 04/16/2014  Allergen Reaction Noted  . Tetracyclines & related Hives and Nausea And Vomiting 03/08/2014    Vitals: BP 111/79 mmHg  Pulse 102  Ht 5\' 7"  (1.702 m)  Wt 149 lb (67.586 kg)  BMI 23.33 kg/m2  LMP  (LMP Unknown) Last Weight:  Wt Readings from Last 1 Encounters:  04/18/14 148 lb (67.132 kg)   Last Height:   Ht Readings from Last 1 Encounters:  04/18/14 5\' 6"  (1.676 m)    Physical exam: Exam: Gen: NAD, conversant, well nourised, well groomed                     CV: RRR, no MRG. No Carotid Bruits. No peripheral edema, warm, nontender Eyes: Conjunctivae clear without exudates or hemorrhage  Neuro: Detailed Neurologic Exam  Speech:    Speech is normal;  fluent and spontaneous with normal comprehension.  Cognition:    The patient is oriented to person, place, and time;     recent and remote memory intact;     language fluent;     normal attention, concentration,     fund of knowledge Cranial Nerves:    The pupils are equal, round, and reactive to light. The fundi are normal and spontaneous venous pulsations are present. Visual fields are full to finger confrontation. Extraocular movements are intact. Trigeminal sensation is intact and the muscles of mastication are normal. The face is symmetric. The palate elevates in the midline. Voice is normal. Shoulder shrug is normal.  The tongue has normal motion without fasciculations.   Coordination:    Normal finger to nose and heel to shin. Normal rapid alternating movements.   Gait:    Heel-toe and tandem gait are normal.   Motor Observation:    No asymmetry, no atrophy, and no involuntary movements noted. Tone:    Normal muscle tone.    Posture:    Posture is normal. normal erect    Strength:    Strength is V/V in the upper and lower limbs.      Sensation: intact     Reflex Exam:  DTR's:    Deep tendon reflexes in the upper and lower extremities are normal bilaterally.   Toes:    The toes are downgoing bilaterally.   Clonus:    Clonus is absent.      Assessment/Plan:  38 year old female here for follow up of temporal lobe seizures. She has not been able to tolerate Vimpat, Keppra, Tegretol or Clonazepam. Will refer patient to Dr. Vickey Hugerohmeier for seconf opinion on seizure medication management. Spent over an hour with patient and husband trying to reassure them. Patient wants to stop medications, feels she was better before. Husband says she was not like this before the seizure medication. I understand how frustrating and scary this can be and reassured her that this is a seizure disorder and that temporal lobe seizures can cause many different phenomena such as deja vu, smells that  are not there, emotions such as fear and misperceptions about her surroundings. She has a history of being sensitive to medications and it may take time to find the right medication. I don't advise stopping seizure medication and this needs to be treated. She can take the Clonazepam as needed for seizure or anxiety and stay on the Keppra for seizure prevention until seen by Dr. Vickey Hugerohmeier.  Naomie DeanAntonia Thong Feeny, MD  [facility] [address] [place], [place] [id number]  Phone [phone] Fax [phone]

## 2014-04-16 NOTE — Telephone Encounter (Signed)
Pt here for appt with Dr. Lucia GaskinsAhern.  Pt will see Dr. Vickey Hugerohmeier next available.  Appt 04-18-14.

## 2014-04-18 ENCOUNTER — Encounter: Payer: Self-pay | Admitting: Neurology

## 2014-04-18 ENCOUNTER — Ambulatory Visit (INDEPENDENT_AMBULATORY_CARE_PROVIDER_SITE_OTHER): Payer: BC Managed Care – PPO | Admitting: Neurology

## 2014-04-18 ENCOUNTER — Other Ambulatory Visit: Payer: Self-pay | Admitting: Neurology

## 2014-04-18 VITALS — BP 115/73 | HR 86 | Resp 12 | Ht 66.0 in | Wt 148.0 lb

## 2014-04-18 DIAGNOSIS — G40909 Epilepsy, unspecified, not intractable, without status epilepticus: Secondary | ICD-10-CM

## 2014-04-18 DIAGNOSIS — G40109 Localization-related (focal) (partial) symptomatic epilepsy and epileptic syndromes with simple partial seizures, not intractable, without status epilepticus: Secondary | ICD-10-CM

## 2014-04-18 DIAGNOSIS — G40209 Localization-related (focal) (partial) symptomatic epilepsy and epileptic syndromes with complex partial seizures, not intractable, without status epilepticus: Secondary | ICD-10-CM

## 2014-04-18 HISTORY — DX: Localization-related (focal) (partial) symptomatic epilepsy and epileptic syndromes with simple partial seizures, not intractable, without status epilepticus: G40.109

## 2014-04-18 MED ORDER — CARBAMAZEPINE ER 200 MG PO TB12: 200.0000 mg | ORAL_TABLET | Freq: Two times a day (BID) | ORAL | Status: DC

## 2014-04-18 MED ORDER — PHENYTOIN SODIUM EXTENDED 100 MG PO CAPS: 300.0000 mg | ORAL_CAPSULE | Freq: Every day | ORAL | Status: DC

## 2014-04-18 NOTE — Patient Instructions (Signed)
Epilepsy °People with epilepsy have times when they shake and jerk uncontrollably (seizures). This happens when there is a sudden change in brain function. Epilepsy may have many possible causes. Anything that disturbs the normal pattern of brain cell activity can lead to seizures. °HOME CARE  °· Follow your doctor's instructions about driving and safety during normal activities. °· Get enough sleep. °· Only take medicine as told by your doctor. °· Avoid things that you know can cause you to have seizures (triggers). °· Write down when your seizures happen and what you remember about each seizure. Write down anything you think may have caused the seizure to happen. °· Tell the people you live and work with that you have seizures. Make sure they know how to help you. They should: °¨ Cushion your head and body. °¨ Turn you on your side. °¨ Not restrain you. °¨ Not place anything inside your mouth. °¨ Call for local emergency medical help if there is any question about what has happened. °· Keep all follow-up visits with your doctor. This is very important. °GET HELP IF: °· You get an infection or start to feel sick. You may have more seizures when you are sick. °· You are having seizures more often. °· Your seizure pattern is changing. °GET HELP RIGHT AWAY IF:  °· A seizure does not stop after a few seconds or minutes. °· A seizure causes you to have trouble breathing. °· A seizure gives you a very bad headache. °· A seizure makes you unable to speak or use a part of your body. °Document Released: 02/22/2009 Document Revised: 02/15/2013 Document Reviewed: 12/07/2012 °[employer]® Patient Information ©2015 [employer]. This information is not intended to replace advice given to you by your health care provider. Make sure you discuss any questions you have with your health care provider. ° °

## 2014-04-18 NOTE — Progress Notes (Signed)
[facility]    Provider:  Dr Lucia GaskinsAhern Referring Provider: Maryelizabeth Rowanewey, Elizabeth, MD Primary Care Physician:  Maryelizabeth RowanEWEY,ELIZABETH, MD  CC:  Cognitive changes  HPI:  Meredith Pratt is a 38 y.o. female here as a second opinion,  referral from Dr. Lucia GaskinsAhern for a second opinion on epilepsy treatment  She has  Lupus and Sjogren's, diagnosed by Dr. Dierdre ForthBeekman 2008 during her preganancy while being [facility] .   Past few months she is having moments where she is having difficulty remembering how to do things. She forgot how to write a check and had to pay with debit. She put her shoes on and realized she forgot her pants. She forgets to turn the car engine off, forgetting lots of objects. She has hand tremors that are increasing. Word-finding difficulties. She has stress but it is more than that. No dysarthria, no vision changes, no headache, no focal weakness, CP, SOB. She is a Runner, broadcasting/film/videoteacher, she has been very functional in the past, this has never happened to her. She feels stupid. She is extremely disturbed, causing significant stress and worry. Forgets tasks at work. Not feeling sad, she does endorse anxiety. Going on for at least several months, becoming more and more apparent and severe - and dangerous. Also tremors worsening. No LOC. No Dizziness. Episodic hand numbness. No fever.   Last phone conversation with Dr Lucia GaskinsAhern:  She can't withdraw information. Randomly her hand jumps. Her right hand jumps, jerks. She feels like her hands are swelling and they feel cartoon-like but when she looks at them they are normal. She feels pain in her hand it just goes away. She is screaming one minute and then crying the next. Husband is here with her. She is crying in the office. She is frustrated. She started Keppra and clonazepam. She had bad tegretol or the vimapt. She was dizzy. She is extrenely anxious. She is yelling at her husband, very emotionally labile. She has slept all weekend. She is extremely worried  that we are missing something. She has always been very sensitive to all medications, this has happened to her multiple times where she had to stop or wean off medications due to side effects.      Meredith Pratt is seen here for the first time with me for evaluation of possible seizures based on the abnormal EEG in context with a normal MRI. The patient reports olfactory auras a high per sense of smell, she is excessively daytime drowsy and fatigued likely as a result of medications. She has had trouble forming. 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  She also reports that she has a distorted body image at times it seems that for example her hands may be not present or may be much larger than they really are and that objects in her surroundings are disproportionately small or large or appear closer or further away than they really are. 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 She has been already on Vimpat, Keppra, and Tegretol.  She took clonazepam this weekend and stated that she couldn't move for 72 hours. Her husband works from home and reported that she came home from school at 3:30 went to sleep and woke up Sunday. And her house was decorated for Christmas !.  Dr Lucia GaskinsAhern has ordered EEG and MRI , the MRI was negative.    FINDINGS: The brain parenchyma shows no abnormal signal intensities. No structural lesion,  tumor or infarcts are noted.No abnormal lesions are seen on diffusion-weighted views to suggest acute ischemia. The cortical sulci, fissures and cisterns are normal in size and appearance. Lateral, third and fourth ventricle are normal in size and appearance. No extra-axial fluid collections are seen. No evidence of mass effect or midline shift. No abnormal lesions are seen on post contrast views. On sagittal views the posterior fossa, pituitary gland and corpus callosum are unremarkable. No evidence of intracranial  hemorrhage on gradient-echo views. The orbits and their contents, paranasal sinuses and calvarium are unremarkable. Intracranial flow voids are present.   IMPRESSION: Normal MRI scan of the brain with and without contrast    INTERPRETING PHYSICIAN:  PRAMOD SETHI, MD    The EEG was abnormal, according to  Dr. Anne HahnWillis, 03-14-14   This let to several medications being introduced and changed , dependent on the side effect panel.     Review of Systems: Patient complains of symptoms per HPI , "word sald " unable to speak in coplete sentences, excessily  Tired, My insides hurt and I feel a i want to jump out of my skin.  Stiff at night. Scared, anxious, diarrhea,fatigue, joint pain, aching muscles, skin sensitivity, anxiety, tremors, memory loss.   Pertinent negatives per HPI. All others negative.   History   Social History  . Marital Status: Married    Spouse Name: N/A    Number of Children: 2  . Years of Education: 12+   Occupational History  . Not on file.   Social History Main Topics  . Smoking status: Never Smoker   . Smokeless tobacco: Never Used  . Alcohol Use: 0.0 oz/week    0 Not specified per week     Comment: social  . Drug Use: No  . Sexual Activity: Not on file   Other Topics Concern  . Not on file   Social History Narrative   Patient is married   Patient has 2 children    Patient has a Master's degree    Caffeine 1 cup daily average    Family History  Problem Relation Age of Onset  . Glaucoma Mother   . Diabetes type II Mother   . Prostate cancer Father   . Coronary artery disease Father     Past Medical History  Diagnosis Date  . Sjogren's syndrome   . Lupus   . Dry eye syndrome   . Polychondritis   . Anxiety     Past Surgical History  Procedure Laterality Date  . Appendectomy  1994    Current Outpatient Prescriptions  Medication Sig Dispense Refill  . azaTHIOprine (IMURAN) 50 MG tablet Take 50 mg by mouth 2 (two) times daily.      Marland Kitchen. escitalopram (LEXAPRO) 10 MG tablet Take 10 mg by mouth daily.    Marland Kitchen. levETIRAcetam (KEPPRA) 500 MG tablet Take 1 tablet (500 mg total) by mouth 2 (two) times daily. 60 tablet 11  . carbamazepine (TEGRETOL-XR) 200 MG 12 hr tablet Take 1 tablet (200 mg total) by mouth 2 (two) times daily. 30 tablet 0  . predniSONE (DELTASONE) 10 MG tablet Take 10 mg by mouth as needed.      No current facility-administered medications for this visit.    Allergies as of 04/18/2014 - Review Complete 04/18/2014  Allergen Reaction Noted  . Tetracyclines & related Hives and Nausea And Vomiting 03/08/2014    Vitals: BP 115/73 mmHg  Pulse 86  Resp 12  Ht 5\' 6"  (1.676 m)  Wt 148 lb (67.132 kg)  BMI 23.90 kg/m2  LMP 04/03/2014 Last Weight:  Wt Readings from Last 1 Encounters:  04/18/14 148 lb (67.132 kg)   Last Height:   Ht Readings from Last 1 Encounters:  04/18/14 5\' 6"  (1.676 m)   Physical exam: Exam: Gen: NAD, conversant, well nourised, well groomed                     CV: RRR, no MRG. No Carotid Bruits. No peripheral edema, warm, nontender Eyes: Conjunctivae clear without exudates or hemorrhage  Neuro: Detailed Neurologic Exam  Speech:    Speech is normal; fluent and spontaneous with normal comprehension.  Cognition:    The patient is oriented to person, place, and time;     recent and remote memory intact;     language fluent;     normal attention, concentration,     fund of knowledge Cranial Nerves:    The pupils are equal, round, and reactive to light. The fundi are normal and spontaneous venous pulsations are present.  Visual fields are full to finger confrontation. Extraocular movements are intact. Trigeminal sensation is intact and the muscles of mastication are normal. The face is symmetric.  The palate elevates in the midline. Voice is hoarse. Shoulder shrug is normal. The tongue has no bite marks, motion without fasciculations.   Coordination:    Normal finger to nose and  heel to shin.  Frequent alternating movement affected , there is a noticeable change in handwriting. "sloppy" .   Gait:    Heel-toe and tandem gait are normal.   Motor Observation:    No asymmetry, no atrophy, and no involuntary movements noted. Tone:    Normal muscle tone.    Posture:    Posture is normal. normal erect    Strength:    Strength is affected, her ability to rise form a seated position was impaired and she has noted during [place] won do that she cannot rise form the floor.      Sensation: intact to LT     Reflex Exam: Could not evaluate left foot due to boot and tendonitis  DTR's:    Deep tendon reflexes in the upper and lower extremities are normal bilaterally.   Toes:    The toes are downgoing bilaterally.   Clonus:    Clonus is absent.    Assessment/Plan:  38 year old female with a PMHx of  2 Connective Tissue Diseases ( Sjoegren's/  Lupus, Dr. Dierdre Forth ).  She was seen here for evaluation of progressive cognitive dysfunction, She has hand tremors that are increasing. Word-finding difficulties. Neuro exam  Reveals a sleepy and incoordinated individual with weakness of the thigh and hip.  Her abnormal EEG and the clinical symptoms of olfactory a aura and abnormal  visual perception let to the diagnosis of  Temporal lobe epilepsy. " [place] syndrome" . She responded to all medications with side effects.   I will wean her off all meds she is currently on and start old fashioned Dilantin, 100 mg today po, 200 mg tonight and 300 mg - the night after.  I will order a [facility] 72 hour EEG with video ASAP as she is not in school.  MRI was normal.   Melvyn Novas, MD  [facility] [address] [place], [place] [address]  Phone [phone] Fax [phone]

## 2014-04-20 ENCOUNTER — Telehealth: Payer: Self-pay | Admitting: Neurology

## 2014-04-20 ENCOUNTER — Encounter: Payer: Self-pay | Admitting: Neurology

## 2014-04-20 NOTE — Telephone Encounter (Signed)
Pt is calling stating she needs documentation stating she can go back to work. She also wants to know if she should stay out until she has the 72 hour EEG.  Please call and advise.  She needs to know something today her employer is saying she has been out for a week and needs a note.

## 2014-04-20 NOTE — Telephone Encounter (Signed)
Spoke to patient and completed a letter. I recommend that she stays home for the next week or at least until the 72-hour eeg is completed.

## 2014-04-24 NOTE — Telephone Encounter (Signed)
Thank you for your help, Sheralyn Boatmanoni !

## 2014-04-25 ENCOUNTER — Telehealth: Payer: Self-pay | Admitting: Neurology

## 2014-04-25 NOTE — Telephone Encounter (Signed)
I called and [facility] to [facility] and they told pt that [employer] is investigating other option (less cost on there end).  I called pt and she stated [employer] approved.

## 2014-04-25 NOTE — Telephone Encounter (Signed)
Patient questioning when will she have the 72 hr EEG and what will be the next step.  Patient stated she's still having symptoms, but not having side effects from Rx phenytoin (DILANTIN) 100 MG ER capsule.  Please call and advise.

## 2014-04-25 NOTE — Telephone Encounter (Signed)
No, thank YOU!

## 2014-04-25 NOTE — Telephone Encounter (Signed)
I called and spoke to Donetta PottsSarah S. Who stated this is approved today at 1453, A# [id number].   I called and relayed to emily at [facility] the Auth # and she will give to billing.  They are to call pt.  And schedule.  Once done and pt can be scheduled for f/u appt Tues ort Thurs at 1200.  Pt verbalized understanding.

## 2014-04-26 ENCOUNTER — Telehealth: Payer: Self-pay | Admitting: Neurology

## 2014-04-26 NOTE — Telephone Encounter (Signed)
She is doing well on dilantin monotherapy for now. She talks and walks and sees normal, still gets a fatigue spell at 2-3 PM but will be able to work in school for the AM . She waits for the 72hour EEG monitoring. [facility] has a [employer] company and that's changed he pre authorization process. Andrey CampanileSandy is working on it. Patient still feels in PM some "seizure auras" ,

## 2014-04-26 NOTE — Telephone Encounter (Signed)
patient doing better on Dilantin monotherapy, is able to work in AM , feels very tired after noon. She is awaiting 72 hour EEG and needs to be seen after 6 -8 days post EEG. Andrey CampanileSandy, will you make sure she is getting an appointnet after the &@ hour EEG> ? CD

## 2014-04-26 NOTE — Telephone Encounter (Signed)
Patient called and stated [facility] stated 72 hr EEG has not been approved.  She verified with Insurance that authorization was approved.  Please call and advise.

## 2014-04-27 NOTE — Telephone Encounter (Signed)
Message from Eugenie BirksJanisha R Cooper sent at 04/27/2014 8:04 AM EST -----  I [facility] with the scheduled appt. Phone note indicates she needs a follow-up a certain amount of days after the study which is ending on 05/04/14. Per Dohmeier she is asking that you get the patient on her schedule within the timeframe. Message from Alverda SkeansSandy Young, RN sent at 04/26/2014 5:35 PM EST -----  [employer] to fax approval letter to [facility], when I spoke to them yesterday. Some issue with [facility] being a third party payor. Thanks  I called and left a message for the patient to call back to schedule a follow up.

## 2014-04-28 ENCOUNTER — Telehealth: Payer: Self-pay | Admitting: Neurology

## 2014-04-28 NOTE — Telephone Encounter (Signed)
Patient is emailing me and calling/texting my personal cell about her eeg denial. I have asked patient multiple times to stop using my personal contact information for patient correspondence stating this is not a [facility] approved method of communication and neither is it a secure method for patient confidentiality.   Meredith KillingsJanisha and Meredith CampanileSandy - can one of you please call patient asap Monday morning and let her know we are working on this and what exactly we are doing to try and work this out? I can assist you with anything you need me to do. (I am including Dr. Vickey Hugerohmeier on this correspondence just so she knows what is going on, but I can assist you on writing any letters or providing documentation as I believe she is out of the office this week)  Thank you.

## 2014-04-30 NOTE — Telephone Encounter (Signed)
I left a message on the voicemail of [facility] to find out what the issue is with the insurance. I also Left a message on the patients voicemail to inform her that I am looking into the issue.

## 2014-04-30 NOTE — Telephone Encounter (Signed)
Called [facility] and spoke with Irving Burtonmily to find out what is going on with the patient 72hr EEG. She states that [employer] approved the provider side of the Authorization but Denied the use of their facility due to it being out of network for [place]. She states that Dr. Vickey Hugerohmeier needs to call in a peer to peer with [employer] at [phone] ext [phone] the case reference number is [id number]. In order to use [facility].

## 2014-05-01 ENCOUNTER — Encounter: Payer: Self-pay | Admitting: Neurology

## 2014-05-01 NOTE — Telephone Encounter (Signed)
I called Irving BurtonEmily with [facility] to inform her that the peer to peer has been done and she will call patient to schedule.

## 2014-05-01 NOTE — Telephone Encounter (Signed)
I was just on the phone with a Dr. Leonor LivHolt from [facility] , you have benefits that apply to the out of network service. Have it done ASAP. I will send a cc to miss Excell Seltzerooper here in office. The [employer] person suggested hospitalization for 3 days to have EEG in an EM Unit!!! Than he stated you have out of network benefits and we can go ahead as planned.  CD

## 2014-05-01 NOTE — Telephone Encounter (Signed)
Out of benefit : approved as out of network benefits.   This is costs to the patient. Member ( patient ) has to decide.  I asked about the in network  Alternative : That's hospital based and they may use Dr Karel JarvisAquino . That's more expensive for the insurance but may leave less costs to the patients. CD the [employer]bs physician peer is to call me - hopefully -back.

## 2014-05-07 DIAGNOSIS — G40919 Epilepsy, unspecified, intractable, without status epilepticus: Secondary | ICD-10-CM

## 2014-05-07 DIAGNOSIS — R404 Transient alteration of awareness: Secondary | ICD-10-CM

## 2014-05-07 NOTE — Telephone Encounter (Signed)
Per Meredith KillingsJanisha:  This has been handled and scheduled for 05/07/14

## 2014-05-07 NOTE — Telephone Encounter (Signed)
I would like you on medication as long as you still feel auras of seizures - While on meds.  If you are spell free, i want you to take one day medication g holiday while hooked up, than continue taking meds. As before. CD

## 2014-05-08 DIAGNOSIS — G40919 Epilepsy, unspecified, intractable, without status epilepticus: Secondary | ICD-10-CM

## 2014-05-08 DIAGNOSIS — R404 Transient alteration of awareness: Secondary | ICD-10-CM

## 2014-05-08 NOTE — Telephone Encounter (Signed)
F/u appt made Tuesday, 05-15-14 at 1200 for pt to go over results [facility] 72 hour EEG.

## 2014-05-09 DIAGNOSIS — G40919 Epilepsy, unspecified, intractable, without status epilepticus: Secondary | ICD-10-CM

## 2014-05-09 DIAGNOSIS — R404 Transient alteration of awareness: Secondary | ICD-10-CM

## 2014-05-14 ENCOUNTER — Telehealth: Payer: Self-pay | Admitting: Neurology

## 2014-05-14 ENCOUNTER — Telehealth: Payer: Self-pay | Admitting: *Deleted

## 2014-05-14 NOTE — Telephone Encounter (Signed)
Emma with [facility] is returning a call regarding patient.

## 2014-05-15 ENCOUNTER — Ambulatory Visit: Payer: BC Managed Care – PPO | Admitting: Neurology

## 2014-05-15 NOTE — Telephone Encounter (Signed)
Spoke to pt and they would be able to get information to us for appt today unless already rescheduled.  I told her already rescheduled for Thursday this week.  Dr. Vickey Hugerohmeier would need to read as well.

## 2014-05-16 ENCOUNTER — Ambulatory Visit: Payer: Self-pay | Admitting: Neurology

## 2014-05-17 ENCOUNTER — Ambulatory Visit (INDEPENDENT_AMBULATORY_CARE_PROVIDER_SITE_OTHER): Payer: BC Managed Care – PPO | Admitting: Neurology

## 2014-05-17 ENCOUNTER — Encounter: Payer: Self-pay | Admitting: Neurology

## 2014-05-17 VITALS — BP 109/73 | HR 97 | Resp 15 | Ht 66.0 in | Wt 149.0 lb

## 2014-05-17 VITALS — BP 120/77 | HR 74

## 2014-05-17 DIAGNOSIS — M3219 Other organ or system involvement in systemic lupus erythematosus: Secondary | ICD-10-CM

## 2014-05-17 DIAGNOSIS — M329 Systemic lupus erythematosus, unspecified: Secondary | ICD-10-CM

## 2014-05-17 DIAGNOSIS — R251 Tremor, unspecified: Secondary | ICD-10-CM

## 2014-05-17 MED ORDER — METHYLPREDNISOLONE SODIUM SUCC 125 MG IJ SOLR: 125.0000 mg | Freq: Once | INTRAMUSCULAR | Status: AC

## 2014-05-17 MED ORDER — SODIUM CHLORIDE 0.9 % IV SOLN
500.0000 mg | INTRAVENOUS | Status: AC
  Administered 2014-05-17: 500 mg via INTRAVENOUS

## 2014-05-17 NOTE — Patient Instructions (Signed)
Lupus Lupus (also called systemic lupus erythematosus, SLE) is a disorder of the body's natural defense system (immune system). In lupus, the immune system attacks various areas of the body (autoimmune disease). CAUSES The cause is unknown. However, lupus runs in families. Certain genes can make you more likely to develop lupus. It is 10 times more common in women than in men. Lupus is also more common in African Americans and Asians. Other factors also play a role, such as viruses (Epstein-Barr virus, EBV), stress, hormones, cigarette smoke, and certain drugs. SYMPTOMS Lupus can affect many parts of the body, including the joints, skin, kidneys, lungs, heart, nervous system, and blood vessels. The signs and symptoms of lupus differ from person to person. The disease can range from mild to life-threatening. Typical features of lupus include:  Butterfly-shaped rash over the face.  Arthritis involving one or more joints.  Kidney disease.  Fever, weight loss, hair loss, fatigue.  Poor circulation in the fingers and toes (Raynaud's disease).  Chest pain when taking deep breaths. Abdominal pain may also occur.  Skin rash in areas exposed to the sun.  Sores in the mouth and nose. DIAGNOSIS Diagnosing lupus can take a long time and is often difficult. An exam and an accurate account of your symptoms and health problems is very important. Blood tests are necessary, though no single test can confirm or rule out lupus. Most people with lupus test positive for antinuclear antibodies (ANA) on a blood test. Additional blood tests, a urine test (urinalysis), and sometimes a kidney or skin tissue sample (biopsy) can help to confirm or rule out lupus. TREATMENT There is no cure for lupus. Your caregiver will develop a treatment plan based on your age, sex, health, symptoms, and lifestyle. The goals are to prevent flares, to treat them when they do occur, and to minimize organ damage and complications. How  the disease may affect each person varies widely. Most people with lupus can live normal lives, but this disorder must be carefully monitored. Treatment must be adjusted as necessary to prevent serious complications. Medicines used for treatment:  Nonsteroidal anti-inflammatory drugs (NSAIDs) decrease inflammation and can help with chest pain, joint pain, and fevers. Examples include ibuprofen and naproxen.  Antimalarial drugs were designed to treat malaria. They also treat fatigue, joint pain, skin rashes, and inflammation of the lungs in patients with lupus.  Corticosteroids are powerful hormones that rapidly suppress inflammation. The lowest dose with the highest benefit will be chosen. They can be given by cream, pills, injections, and through the vein (intravenously).  Immunosuppressive drugs block the making of immune cells. They may be used for kidney or nerve disease. HOME CARE INSTRUCTIONS  Exercise. Low-impact activities can usually help keep joints flexible without being too strenuous.  Rest after periods of exercise.  Avoid excessive sun exposure.  Follow proper nutrition and take supplements as recommended by your caregiver.  Stress management can be helpful. SEEK MEDICAL CARE IF:  You have increased fatigue.  You develop pain.  You develop a rash.  You have an oral temperature above 102 F (38.9 C).  You develop abdominal discomfort.  You develop a headache.  You experience dizziness. FOR MORE INFORMATION National Institute of Neurological Disorders and Stroke: www.ninds.nih.gov American College of Rheumatology: www.rheumatology.org National Institute of Arthritis and Musculoskeletal and Skin Diseases: www.niams.nih.gov Document Released: 04/17/2002 Document Revised: 07/20/2011 Document Reviewed: 08/08/2009 ExitCare Patient Information 2015 [employer]. This information is not intended to replace advice given to you by your health   care provider. Make sure  you discuss any questions you have with your health care provider.  

## 2014-05-17 NOTE — Patient Instructions (Signed)
Patient will call next week to let Dr. Vickey Hugerohmeier know how she is doing.

## 2014-05-17 NOTE — Progress Notes (Signed)
Patient here seeing Dr. Vickey Hugerohmeier.  Order for Solumedrol 500mg  IV.  Patient to treatment room.   IV started in left AC, good blood return, 24g angiocath.   Solumedrol 500/100cc 0.9% NaCl started at 1520.  Upon completion apporoximately 50cc NaCL 0.9% flushed IV line.     IV discontinued and removed at 1550

## 2014-05-17 NOTE — Progress Notes (Signed)
[facility]    Provider:  Dr Lucia Gaskins Referring Provider: Maryelizabeth Rowan, MD Primary Care Physician:  Maryelizabeth Rowan, MD  CC:  Cognitive changes  HPI:  Meredith Pratt is a 39 y.o. female here as a second opinion,  referral from Dr. Lucia Gaskins for a second opinion on epilepsy treatment  She has  Lupus and Sjogren's, diagnosed by Dr. Dierdre Forth 2008 during her preganancy while being seen at [facility] .   Past few months she is having moments where she is having difficulty remembering how to do things. She forgot how to write a check and had to pay with debit. She put her shoes on and realized she forgot her pants. She forgets to turn the car engine off, forgetting lots of objects. She has hand tremors that are increasing. Word-finding difficulties. She has stress but it is more than that. No dysarthria, no vision changes, no headache, no focal weakness, CP, SOB. She is a Runner, broadcasting/film/video, she has been very functional in the past, this has never happened to her. She feels stupid. She is extremely disturbed, causing significant stress and worry. Forgets tasks at work. Not feeling sad, she does endorse anxiety. Going on for at least several months, becoming more and more apparent and severe - and dangerous. Also tremors worsening. No LOC. No Dizziness. Episodic hand numbness. No fever.    17 May 2014:  She underwent  72 hours of EEG with Video and the study was surprisingly normal. The patient will now need to undergo an LP to see if cerebral vasculitis signs are present, too small for brain MRI to capture. There are multiple sites with active Lupus , her fingers hurt. Whole body achines and fatigue.     Last phone conversation with Dr Lucia Gaskins:  She can't withdraw information. Randomly her hand jumps. Her right hand jumps, jerks. She feels like her hands are swelling and they feel cartoon-like but when she looks at them they are normal. She feels pain in her hand it just goes away. She is screaming  one minute and then crying the next. Husband is here with her. She is crying in the office. She is frustrated. She started Keppra and clonazepam. She had bad tegretol or the vimapt. She was dizzy. She is extrenely anxious. She is yelling at her husband, very emotionally labile. She has slept all weekend. She is extremely worried that we are missing something. She has always been very sensitive to all medications, this has happened to her multiple times where she had to stop or wean off medications due to side effects.      [name] is seen here for the first time with me for evaluation of possible seizures based on the abnormal EEG in context with a normal MRI. The patient reports olfactory auras a high per sense of smell, she is excessively daytime drowsy and fatigued likely as a result of medications. She has had trouble forming. 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  She also reports that she has a distorted body image at times it seems that for example her hands may be not present or may be much larger than they really are and that objects in her surroundings are disproportionately small or large or appear closer or further away than they really are. 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 She has been already on Vimpat, Keppra, and Tegretol.  She took clonazepam this weekend and stated that  she couldn't move for 72 hours. Her husband works from home and reported that she came home from school at 3:30 went to sleep and woke up Sunday. And her house was decorated for Christmas !.  Dr Lucia GaskinsAhern has ordered EEG and MRI , the MRI was negative.    FINDINGS: The brain parenchyma shows no abnormal signal intensities. No structural lesion, tumor or infarcts are noted.No abnormal lesions are seen on diffusion-weighted views to suggest acute ischemia. The cortical sulci, fissures and cisterns are normal in size and appearance. Lateral,  third and fourth ventricle are normal in size and appearance. No extra-axial fluid collections are seen. No evidence of mass effect or midline shift. No abnormal lesions are seen on post contrast views. On sagittal views the posterior fossa, pituitary gland and corpus callosum are unremarkable. No evidence of intracranial hemorrhage on gradient-echo views. The orbits and their contents, paranasal sinuses and calvarium are unremarkable. Intracranial flow voids are present.   IMPRESSION: Normal MRI scan of the brain with and without contrast    INTERPRETING PHYSICIAN:  PRAMOD SETHI, MD   The EEG was abnormal, according to  Dr. Anne HahnWillis, 03-14-14   This let to several medications being introduced and changed , dependent on the side effect panel.     Review of Systems: Patient complains of symptoms per HPI , "word sald " unable to speak in coplete sentences, excessily tired, My insides hurt and I feel a i want to jump out of my skin.  Stiff at night. Skin is blistered, not blistered.  Scared, anxious, diarrhea,fatigue, joint pain, aching muscles, especially ear and thigh hurt, some times dry eye and jaw tenderness.   skin sensitivity, anxiety, tremors, memory loss.   Pertinent negatives per HPI. All others negative.   History   Social History  . Marital Status: Married    Spouse Name: N/A    Number of Children: 2  . Years of Education: 12+   Occupational History  . Not on file.   Social History Main Topics  . Smoking status: Never Smoker   . Smokeless tobacco: Never Used  . Alcohol Use: 0.0 oz/week    0 Not specified per week     Comment: social  . Drug Use: No  . Sexual Activity: Not on file   Other Topics Concern  . Not on file   Social History Narrative   Patient is married   Patient has 2 children    Patient has a Master's degree    Caffeine 1 cup daily average    Family History  Problem Relation Age of Onset  . Glaucoma Mother   . Diabetes type II Mother    . Prostate cancer Father   . Coronary artery disease Father     Past Medical History  Diagnosis Date  . Sjogren's syndrome   . Lupus   . Dry eye syndrome   . Polychondritis   . Anxiety   . 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 Appendectomy  1994    Current Outpatient Prescriptions  Medication Sig Dispense Refill  . azaTHIOprine (IMURAN) 50 MG tablet Take 50 mg by mouth 2 (two) times daily.    Marland Kitchen. escitalopram (LEXAPRO) 10 MG tablet Take 10 mg by mouth daily.    Marland Kitchen. levETIRAcetam (KEPPRA) 500 MG tablet Take 1 tablet (500 mg total) by mouth 2 (two) times daily. (Patient not taking: Reported on 05/17/2014) 60 tablet 11  .  phenytoin (DILANTIN) 100 MG ER capsule Take 3 capsules (300 mg total) by mouth at bedtime. (Patient not taking: Reported on 05/17/2014) 90 capsule 0  . predniSONE (DELTASONE) 10 MG tablet Take 10 mg by mouth as needed.      No current facility-administered medications for this visit.    Allergies as of 05/17/2014 - Review Complete 05/17/2014  Allergen Reaction Noted  . Tetracyclines & related Hives and Nausea And Vomiting 03/08/2014    Vitals: BP 109/73 mmHg  Pulse 97  Resp 15  Ht  (1.676 m)  Wt 149 lb (67.586 kg)  BMI 24.06 kg/m2  LMP 04/03/2014 Last Weight:  Wt Readings from Last 1 Encounters:  05/17/14 149 lb (67.586 kg)   Last Height:   Ht Readings from Last 1 Encounters:  05/17/14  (1.676 m)   Physical exam: Exam: Gen: NAD, conversant, well nourised, well groomed                     CV: RRR, no MRG. No Carotid Bruits. No peripheral edema, warm, nontender Eyes: Conjunctivae clear without exudates or hemorrhage  Neuro: Detailed Neurologic Exam  Speech:    Speech is normal; fluent and spontaneous with normal comprehension.  Cognition:    The patient is oriented to person, place, and time;     recent and remote memory intact;     language fluent;     normal attention,  concentration,     fund of knowledge Cranial Nerves:    The pupils are equal, round, and reactive to light. The fundi are normal and spontaneous venous pulsations are present.  Visual fields are full to finger confrontation. Extraocular movements are intact. Trigeminal sensation is intact and the muscles of mastication are normal. The face is symmetric.  The palate elevates in the midline. Voice is hoarse. Shoulder shrug is normal. The tongue has no bite marks, motion without fasciculations.   Coordination:    Normal finger to nose and heel to shin.  Frequent alternating movement affected , there is a noticeable change in handwriting. "sloppy" .   Gait:    Heel-toe and tandem gait are normal.   Motor Observation:    No asymmetry, no atrophy, and no involuntary movements noted. Tone:    Normal muscle tone.    Posture:    Posture is normal. normal erect    Strength:    Strength is affected, her ability to rise form a seated position was impaired and she has noted during [place] won do that she cannot rise form the floor.      Sensation: intact to LT     Reflex Exam: Could not evaluate left foot due to boot and tendonitis  DTR's:    Deep tendon reflexes in the upper and lower extremities are normal bilaterally.   Toes:    The toes are downgoing bilaterally.   Clonus:    Clonus is absent.    Assessment/Plan:  39 year old female with a PMHx of  2 Connective Tissue Diseases ( Sjoegren's/  Lupus, Dr. Dierdre Forth ).  She was seen here for evaluation of progressive cognitive dysfunction, She has hand tremors that are increasing. Word-finding difficulties. Neuro exam  Reveals a sleepy and incoordinated individual with weakness of the thigh and hip.  Her abnormal  30 minute EEG and the clinical symptoms of olfactory a aura and abnormal  visual perception let to the presumed diagnosis of  Temporal lobe epilepsy. " [place] syndrome" .  She responded to all medications with side  effects. She continues to put soap on her tooth paste, she feels often as if in a days or tongs. Sometime she stares off she has trembling episodes..  I have weaned her off all meds , including the last one, dilantin . Her  [facility] 72 hour EEG with video was normal, reviewed in detail and discussion with patient. The normal prolonged EEG was a very surprising results. The patient confirmed that she was actually not taking any of the antiepileptics prescribed during the study. She has been taking prednisone on and off. I have discussed with her the possibility of a lumbar puncture to be performed to see if active inflammatory markers are present in this the cerebral spinal fluid. She is concerned about this and would like to hold off I have also offered her a Solu-Medrol dose of pulse IV treatment also is less specific it may give [place] an answer if she remains spell free for significant after this infusion that lupus may be causing these spells at origin. I will ask my nurse to prepare the infusion for today.   Porfirio Mylar  Ayshia Gramlich M.D. Her 72 hour EEg and the  brain MRI : images and tracings reviewed in the room. Normal.   Cc Dr  Alben Deeds,      Melvyn Novas, MD  [facility] [address] [place], [place] [address]  Phone [phone] Fax [phone]

## 2014-05-21 ENCOUNTER — Telehealth: Payer: Self-pay | Admitting: Neurology

## 2014-05-21 ENCOUNTER — Other Ambulatory Visit: Payer: Self-pay | Admitting: *Deleted

## 2014-05-21 DIAGNOSIS — G40909 Epilepsy, unspecified, not intractable, without status epilepticus: Secondary | ICD-10-CM

## 2014-05-21 NOTE — Telephone Encounter (Signed)
Ramash, [phone], received Rx refill request for methylPREDNISolone sodium succinate (SOLU-MEDROL) 125 mg/2 mL injection.  Pharmacist stated they don't have medication.  Please call and advise.

## 2014-05-21 NOTE — Telephone Encounter (Signed)
This med is administered in the office.  I called back.  They are aware.

## 2014-05-31 NOTE — Telephone Encounter (Signed)
LMVM for pt that I do not have [facility] information as yet, and needed to reschedule appt until next week.   Did speak with pt and rescheduled appt.

## 2015-04-17 DIAGNOSIS — M3500 Sicca syndrome, unspecified: Secondary | ICD-10-CM | POA: Insufficient documentation

## 2015-04-17 DIAGNOSIS — Z79899 Other long term (current) drug therapy: Secondary | ICD-10-CM | POA: Insufficient documentation

## 2015-04-17 DIAGNOSIS — M359 Systemic involvement of connective tissue, unspecified: Secondary | ICD-10-CM | POA: Insufficient documentation

## 2015-11-20 ENCOUNTER — Other Ambulatory Visit (HOSPITAL_COMMUNITY): Payer: Self-pay | Admitting: Internal Medicine

## 2015-11-20 DIAGNOSIS — R0602 Shortness of breath: Secondary | ICD-10-CM

## 2015-11-21 ENCOUNTER — Other Ambulatory Visit (HOSPITAL_COMMUNITY): Payer: Self-pay | Admitting: Respiratory Therapy

## 2015-11-21 DIAGNOSIS — R0602 Shortness of breath: Secondary | ICD-10-CM

## 2015-11-27 ENCOUNTER — Ambulatory Visit (HOSPITAL_COMMUNITY)
Admission: RE | Admit: 2015-11-27 | Discharge: 2015-11-27 | Disposition: A | Discharge status: Home / Self Care | Payer: BC Managed Care – PPO | Source: Ambulatory Visit | Attending: Internal Medicine | Admitting: Internal Medicine

## 2015-11-27 DIAGNOSIS — R0602 Shortness of breath: Secondary | ICD-10-CM

## 2015-11-27 DIAGNOSIS — R079 Chest pain, unspecified: Secondary | ICD-10-CM | POA: Diagnosis present

## 2015-11-27 DIAGNOSIS — I340 Nonrheumatic mitral (valve) insufficiency: Secondary | ICD-10-CM | POA: Insufficient documentation

## 2015-11-27 LAB — SPIROMETRY WITH GRAPH
FEF 25-75 Pre: 3.76 L/sec
FEF2575-%Pred-Pre: 123 %
FEV1-%PRED-PRE: 114 %
FEV1-PRE: 3.4 L
FEV1FVC-%PRED-PRE: 101 %
FEV6-%PRED-PRE: 112 %
FEV6-PRE: 4.03 L
FEV6FVC-%Pred-Pre: 102 %
FVC-%Pred-Pre: 112 %
FVC-Pre: 4.1 L
PRE FEV1/FVC RATIO: 83 %
Pre FEV6/FVC Ratio: 100 %

## 2015-11-27 NOTE — Progress Notes (Signed)
*  PRELIMINARY RESULTS* Echocardiogram 2D Echocardiogram has been performed.  Meredith Pratt, Meredith Pratt 11/27/2015, 10:03 AM

## 2015-12-26 ENCOUNTER — Other Ambulatory Visit: Payer: Self-pay | Admitting: Internal Medicine

## 2015-12-26 ENCOUNTER — Ambulatory Visit
Admission: RE | Admit: 2015-12-26 | Discharge: 2015-12-26 | Disposition: A | Discharge status: Home / Self Care | Payer: BC Managed Care – PPO | Source: Ambulatory Visit | Attending: Internal Medicine | Admitting: Internal Medicine

## 2015-12-26 ENCOUNTER — Other Ambulatory Visit: Payer: BC Managed Care – PPO

## 2015-12-26 DIAGNOSIS — M3500 Sicca syndrome, unspecified: Secondary | ICD-10-CM

## 2015-12-26 DIAGNOSIS — J849 Interstitial pulmonary disease, unspecified: Secondary | ICD-10-CM

## 2016-12-23 ENCOUNTER — Ambulatory Visit (INDEPENDENT_AMBULATORY_CARE_PROVIDER_SITE_OTHER): Payer: BC Managed Care – PPO

## 2016-12-23 ENCOUNTER — Encounter: Payer: Self-pay | Admitting: Podiatry

## 2016-12-23 ENCOUNTER — Ambulatory Visit (INDEPENDENT_AMBULATORY_CARE_PROVIDER_SITE_OTHER): Payer: BC Managed Care – PPO | Admitting: Podiatry

## 2016-12-23 VITALS — BP 122/74 | HR 108 | Resp 18

## 2016-12-23 DIAGNOSIS — M722 Plantar fascial fibromatosis: Secondary | ICD-10-CM | POA: Diagnosis not present

## 2016-12-23 DIAGNOSIS — M7752 Other enthesopathy of left foot: Secondary | ICD-10-CM | POA: Diagnosis not present

## 2016-12-23 DIAGNOSIS — M779 Enthesopathy, unspecified: Secondary | ICD-10-CM

## 2016-12-23 DIAGNOSIS — M778 Other enthesopathies, not elsewhere classified: Secondary | ICD-10-CM

## 2016-12-23 NOTE — Progress Notes (Signed)
   Subjective:    Patient ID: Meredith RuskErin M Pratt, female    DOB: 02/21/1976, 41 y.o.   MRN: [id number]  HPI   This patient presents today complaining of approximately 8 month history of pain on the plantar MPJ of the left foot aggravated with standing walking relieved with rest and shoe change, woman's wedge shoe in the past 4 weeks the pain is increased on the plantar aspect left foot. Patient also feels as if there is a palpable thickening in the plantar lesser MPJ left foot. Patient has complex autoimmune disorders including rheumatoid arthritis and Sjogrens syndrome. [facility] Patient job duties require some walking which increases the symptoms     Review of Systems  Musculoskeletal:       Endral injections  All other systems reviewed and are negative.      Objective:   Physical Exam Orientated 3  Vascular: No calf edema or calf tenderness bilaterally DP and PT pulses 2/4 bilaterally Capillary reflex immediate bilaterally  Neurological: Sensation to 10 g monofilament wire intact 9/5 bilaterally Vibratory sensation reactive bilaterally Ankle reflexes reactive bilaterally  Dermatological: No open skin lesions bilaterally Small plantar callus subsecond MPJ bilaterally  Musculoskeletal: Palpable tenderness plantar second MPJ area with some palpable fibrous thickening within the plantar second MPJ Slight tenderness on range of motion third MPJ without crepitus Pain is duplicated in the plantar second and third MPJ are palpated Mild divergence second and third toes left  X-ray examination weightbearing left foot dated 12/23/2016  Intact bony structure fracture and/or dislocation Bone density appears within normal limits Joint spaces within normal limits throughout all views  Radiographic impression: No acute bony abnormality noted in the weightbearing x-ray left foot dated 12/23/2016        Assessment & Plan:   Assessment: Reactive  fibrous tissue plantar second MPJ area left foot Capsulitis second-third MPJ left Possible beginning pre-dislocation syndrome left All the above may be mechanical or possibly associated with autoimmune disorders  Plan: Dispensed Darco shoe wear and left foot Instructed patient to discuss pending visit with rheumatologist next week increasing prednisone dose for approximately 1 week  Reappoint at patient's request

## 2016-12-23 NOTE — Patient Instructions (Signed)
Today your foot examination demonstrated adequate bone and joint structure There is tenderness localized on the bottom of the second third toe area with a fibrous thickening This inflammation may be secondary to mechanical issues or possible rheumatoid issues Recommend that you contact your own rheumatologist and increase your prednisone for approximately one week Wear the surgical wedge shoe on the left foot as needed to redistribute the weight to the heel area of the foot Also, the separation between the second and third toes could be the beginning symptoms of local inflammation area.

## 2017-02-04 ENCOUNTER — Other Ambulatory Visit: Payer: Self-pay | Admitting: Family

## 2017-02-04 DIAGNOSIS — Z7952 Long term (current) use of systemic steroids: Secondary | ICD-10-CM

## 2017-03-02 ENCOUNTER — Ambulatory Visit
Admission: RE | Admit: 2017-03-02 | Discharge: 2017-03-02 | Disposition: A | Discharge status: Home / Self Care | Payer: BC Managed Care – PPO | Source: Ambulatory Visit | Attending: Family | Admitting: Family

## 2017-03-02 DIAGNOSIS — Z7952 Long term (current) use of systemic steroids: Secondary | ICD-10-CM

## 2017-06-28 DIAGNOSIS — M0579 Rheumatoid arthritis with rheumatoid factor of multiple sites without organ or systems involvement: Secondary | ICD-10-CM | POA: Insufficient documentation

## 2017-09-07 DIAGNOSIS — K120 Recurrent oral aphthae: Secondary | ICD-10-CM | POA: Insufficient documentation

## 2018-04-14 DIAGNOSIS — R0609 Other forms of dyspnea: Secondary | ICD-10-CM | POA: Insufficient documentation

## 2018-04-14 DIAGNOSIS — R0600 Dyspnea, unspecified: Secondary | ICD-10-CM | POA: Insufficient documentation

## 2019-03-27 DIAGNOSIS — L930 Discoid lupus erythematosus: Secondary | ICD-10-CM | POA: Insufficient documentation

## 2019-10-16 ENCOUNTER — Other Ambulatory Visit: Payer: Self-pay | Admitting: Family Medicine

## 2019-10-16 ENCOUNTER — Ambulatory Visit
Admission: RE | Admit: 2019-10-16 | Discharge: 2019-10-16 | Disposition: A | Discharge status: Home / Self Care | Payer: BC Managed Care – PPO | Source: Ambulatory Visit | Attending: Family Medicine | Admitting: Family Medicine

## 2019-10-16 DIAGNOSIS — R079 Chest pain, unspecified: Secondary | ICD-10-CM

## 2020-02-20 ENCOUNTER — Other Ambulatory Visit: Payer: Self-pay | Admitting: Obstetrics and Gynecology

## 2020-02-20 DIAGNOSIS — R928 Other abnormal and inconclusive findings on diagnostic imaging of breast: Secondary | ICD-10-CM

## 2020-02-26 ENCOUNTER — Other Ambulatory Visit: Payer: Self-pay

## 2020-02-26 ENCOUNTER — Other Ambulatory Visit: Payer: Self-pay | Admitting: Obstetrics and Gynecology

## 2020-02-26 ENCOUNTER — Ambulatory Visit
Admission: RE | Admit: 2020-02-26 | Discharge: 2020-02-26 | Disposition: A | Discharge status: Home / Self Care | Payer: BC Managed Care – PPO | Source: Ambulatory Visit | Attending: Obstetrics and Gynecology | Admitting: Obstetrics and Gynecology

## 2020-02-26 DIAGNOSIS — R928 Other abnormal and inconclusive findings on diagnostic imaging of breast: Secondary | ICD-10-CM

## 2020-05-28 ENCOUNTER — Ambulatory Visit: Payer: BC Managed Care – PPO | Admitting: Podiatry

## 2020-05-28 ENCOUNTER — Other Ambulatory Visit: Payer: Self-pay

## 2020-05-28 ENCOUNTER — Ambulatory Visit (INDEPENDENT_AMBULATORY_CARE_PROVIDER_SITE_OTHER): Payer: BC Managed Care – PPO

## 2020-05-28 ENCOUNTER — Other Ambulatory Visit: Payer: Self-pay | Admitting: Podiatry

## 2020-05-28 DIAGNOSIS — N898 Other specified noninflammatory disorders of vagina: Secondary | ICD-10-CM | POA: Insufficient documentation

## 2020-05-28 DIAGNOSIS — B3731 Acute candidiasis of vulva and vagina: Secondary | ICD-10-CM | POA: Insufficient documentation

## 2020-05-28 DIAGNOSIS — B9689 Other specified bacterial agents as the cause of diseases classified elsewhere: Secondary | ICD-10-CM | POA: Insufficient documentation

## 2020-05-28 DIAGNOSIS — M7732 Calcaneal spur, left foot: Secondary | ICD-10-CM

## 2020-05-28 DIAGNOSIS — M7731 Calcaneal spur, right foot: Secondary | ICD-10-CM

## 2020-05-28 DIAGNOSIS — M0579 Rheumatoid arthritis with rheumatoid factor of multiple sites without organ or systems involvement: Secondary | ICD-10-CM

## 2020-05-28 DIAGNOSIS — M79672 Pain in left foot: Secondary | ICD-10-CM

## 2020-05-28 DIAGNOSIS — M216X9 Other acquired deformities of unspecified foot: Secondary | ICD-10-CM | POA: Diagnosis not present

## 2020-05-28 DIAGNOSIS — N760 Acute vaginitis: Secondary | ICD-10-CM | POA: Insufficient documentation

## 2020-05-28 DIAGNOSIS — B373 Candidiasis of vulva and vagina: Secondary | ICD-10-CM | POA: Insufficient documentation

## 2020-05-28 DIAGNOSIS — M722 Plantar fascial fibromatosis: Secondary | ICD-10-CM

## 2020-05-28 DIAGNOSIS — M79671 Pain in right foot: Secondary | ICD-10-CM

## 2020-05-28 MED ORDER — BETAMETHASONE SOD PHOS & ACET 6 (3-3) MG/ML IJ SUSP
6.0000 mg | Freq: Once | INTRAMUSCULAR | Status: AC
  Administered 2020-05-28: 6 mg

## 2020-05-28 NOTE — Patient Instructions (Signed)

## 2020-05-28 NOTE — Progress Notes (Signed)
  Subjective:  Patient ID: Meredith Pratt, female    DOB: 1975/11/19,  MRN: [id number]  Chief Complaint  Patient presents with  . Foot Pain    Bilateral heel/foot pain since September 2021, no known injuries, OTC Medication is unhelpful.   45 y.o. female presents with the above complaint. History confirmed with patient.  Objective:  Physical Exam: warm, good capillary refill, no trophic changes or ulcerative lesions, normal DP and PT pulses and normal sensory exam. Left Foot: mild tenderness to palpation medial calcaneal tuber, no pain with calcaneal squeeze, decreased ankle joint ROM and +Silverskiold test Right Foot: tenderness to palpation medial calcaneal tuber, no pain with calcaneal squeeze, decreased ankle joint ROM and +Silverskiold test normal exam, no swelling, tenderness, instability; ligaments intact, full range of motion of all ankle/foot joints other than findings noted above.  Radiographs: X-ray of both feet: no evidence of calcaneal stress fracture, plantar calcaneal spur and posterior calcaneal spur  Assessment:   1. Plantar fasciitis   2. Heel spur, left   3. Heel spur, right   4. Equinus deformity of foot   5. Rheumatoid arthritis of multiple sites without organ or system involvement with positive rheumatoid factor (HCC)    Plan:  Patient was evaluated and treated and all questions answered.  Plantar Fasciitis -XR reviewed with patient -Educated patient on stretching and icing of the affected limb -Plantar fascial brace dispensed -Injection delivered to the plantar fascia of the right foot.  Procedure: Injection Tendon/Ligament Consent: Verbal consent obtained. Location: Right plantar fascia at the glabrous junction; medial approach. Skin Prep: Alcohol. Injectate: 1 cc 0.5% marcaine plain, 1 cc dexamethasone phosphate, 0.5 cc kenalog 10. Disposition: Patient tolerated procedure well. Injection site dressed with a band-aid.    Return in about 4 weeks  (around 06/25/2020) for Plantar fasciitis.

## 2020-06-11 ENCOUNTER — Encounter: Payer: Self-pay | Admitting: Podiatry

## 2020-06-25 ENCOUNTER — Ambulatory Visit: Payer: BC Managed Care – PPO | Admitting: Podiatry

## 2020-09-04 ENCOUNTER — Other Ambulatory Visit: Payer: Self-pay

## 2020-09-04 ENCOUNTER — Other Ambulatory Visit: Payer: Self-pay | Admitting: Obstetrics and Gynecology

## 2020-09-04 ENCOUNTER — Ambulatory Visit
Admission: RE | Admit: 2020-09-04 | Discharge: 2020-09-04 | Disposition: A | Discharge status: Home / Self Care | Payer: BC Managed Care – PPO | Source: Ambulatory Visit | Attending: Obstetrics and Gynecology | Admitting: Obstetrics and Gynecology

## 2020-09-04 DIAGNOSIS — R928 Other abnormal and inconclusive findings on diagnostic imaging of breast: Secondary | ICD-10-CM

## 2020-09-04 DIAGNOSIS — R921 Mammographic calcification found on diagnostic imaging of breast: Secondary | ICD-10-CM

## 2021-02-10 ENCOUNTER — Other Ambulatory Visit: Payer: Self-pay | Admitting: Obstetrics and Gynecology

## 2021-02-10 ENCOUNTER — Ambulatory Visit
Admission: RE | Admit: 2021-02-10 | Discharge: 2021-02-10 | Disposition: A | Discharge status: Home / Self Care | Payer: BC Managed Care – PPO | Source: Ambulatory Visit | Attending: Obstetrics and Gynecology | Admitting: Obstetrics and Gynecology

## 2021-02-10 ENCOUNTER — Other Ambulatory Visit: Payer: Self-pay

## 2021-02-10 DIAGNOSIS — R921 Mammographic calcification found on diagnostic imaging of breast: Secondary | ICD-10-CM

## 2021-08-12 ENCOUNTER — Ambulatory Visit
Admission: RE | Admit: 2021-08-12 | Discharge: 2021-08-12 | Disposition: A | Discharge status: Home / Self Care | Payer: BC Managed Care – PPO | Source: Ambulatory Visit | Attending: Obstetrics and Gynecology | Admitting: Obstetrics and Gynecology

## 2021-08-12 ENCOUNTER — Other Ambulatory Visit: Payer: Self-pay | Admitting: Obstetrics and Gynecology

## 2021-08-12 DIAGNOSIS — R921 Mammographic calcification found on diagnostic imaging of breast: Secondary | ICD-10-CM

## 2022-02-12 ENCOUNTER — Ambulatory Visit
Admission: RE | Admit: 2022-02-12 | Discharge: 2022-02-12 | Disposition: A | Discharge status: Home / Self Care | Payer: BC Managed Care – PPO | Source: Ambulatory Visit | Attending: Obstetrics and Gynecology | Admitting: Obstetrics and Gynecology

## 2022-02-12 DIAGNOSIS — R921 Mammographic calcification found on diagnostic imaging of breast: Secondary | ICD-10-CM

## 2022-03-18 ENCOUNTER — Other Ambulatory Visit: Payer: Self-pay | Admitting: Family Medicine

## 2022-03-18 DIAGNOSIS — M5416 Radiculopathy, lumbar region: Secondary | ICD-10-CM

## 2022-04-11 ENCOUNTER — Inpatient Hospital Stay: Admission: RE | Admit: 2022-04-11 | Payer: BC Managed Care – PPO | Source: Ambulatory Visit

## 2022-04-15 ENCOUNTER — Ambulatory Visit
Admission: RE | Admit: 2022-04-15 | Discharge: 2022-04-15 | Disposition: A | Discharge status: Home / Self Care | Payer: BC Managed Care – PPO | Source: Ambulatory Visit | Attending: Family Medicine | Admitting: Family Medicine

## 2022-04-15 DIAGNOSIS — M5416 Radiculopathy, lumbar region: Secondary | ICD-10-CM

## 2022-04-15 MED ORDER — GADOPICLENOL 0.5 MMOL/ML IV SOLN
7.0000 mL | Freq: Once | INTRAVENOUS | Status: AC | PRN
  Administered 2022-04-15: 7 mL via INTRAVENOUS

## 2022-04-15 MED ORDER — HEPARIN SOD (PORK) LOCK FLUSH 100 UNIT/ML IV SOLN
500.0000 [IU] | Freq: Once | INTRAVENOUS | Status: AC
  Administered 2022-04-15: 500 [IU] via INTRAVENOUS

## 2022-04-15 MED ORDER — SODIUM CHLORIDE 0.9% FLUSH
10.0000 mL | INTRAVENOUS | Status: DC | PRN
  Administered 2022-04-15: 10 mL via INTRAVENOUS

## 2023-02-01 ENCOUNTER — Other Ambulatory Visit: Payer: Self-pay | Admitting: Obstetrics and Gynecology

## 2023-02-01 DIAGNOSIS — R921 Mammographic calcification found on diagnostic imaging of breast: Secondary | ICD-10-CM

## 2023-02-17 ENCOUNTER — Other Ambulatory Visit: Payer: Self-pay | Admitting: Obstetrics and Gynecology

## 2023-02-17 DIAGNOSIS — R921 Mammographic calcification found on diagnostic imaging of breast: Secondary | ICD-10-CM

## 2023-02-18 ENCOUNTER — Ambulatory Visit
Admission: RE | Admit: 2023-02-18 | Discharge: 2023-02-18 | Disposition: A | Discharge status: Home / Self Care | Payer: BC Managed Care – PPO | Source: Ambulatory Visit | Attending: Obstetrics and Gynecology | Admitting: Obstetrics and Gynecology

## 2023-02-18 DIAGNOSIS — R921 Mammographic calcification found on diagnostic imaging of breast: Secondary | ICD-10-CM

## 2023-08-19 IMAGING — MG MM DIGITAL DIAGNOSTIC UNILAT*R* W/ TOMO W/ CAD
6 series · 6 of 14 positions shown · non-contrast
Comparison: Previous exam(s).

CLINICAL DATA: 46-year-old female presenting for six-month
follow-up of probably benign right breast calcifications.

EXAM:
DIGITAL DIAGNOSTIC UNILATERAL RIGHT MAMMOGRAM WITH TOMOSYNTHESIS AND
CAD
TECHNIQUE: Right digital diagnostic mammography and breast tomosynthesis was
performed. The images were evaluated with computer-aided detection.

[R ML]
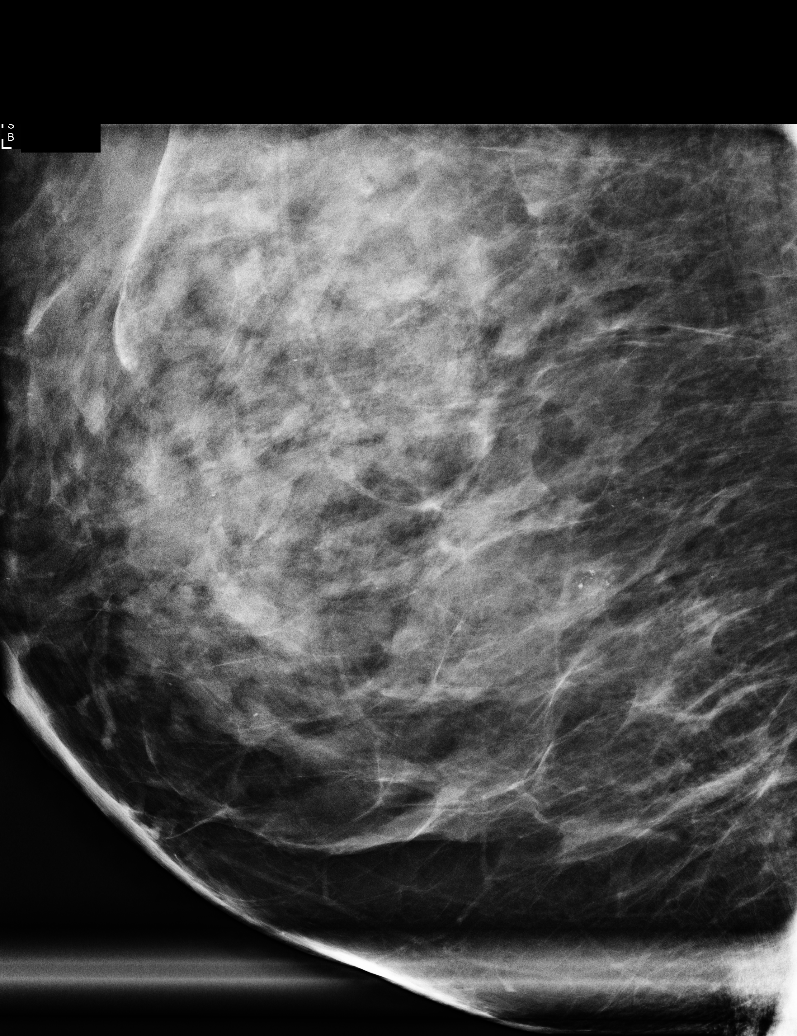

[R CC]
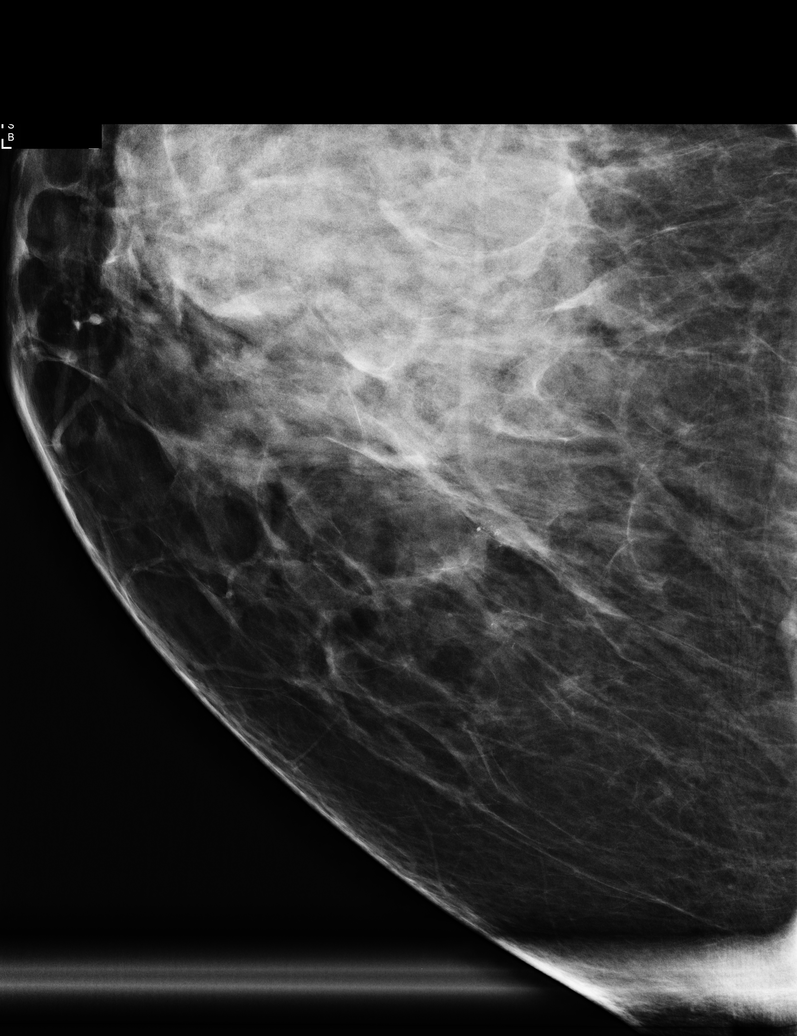

[R MLO synth-2D]
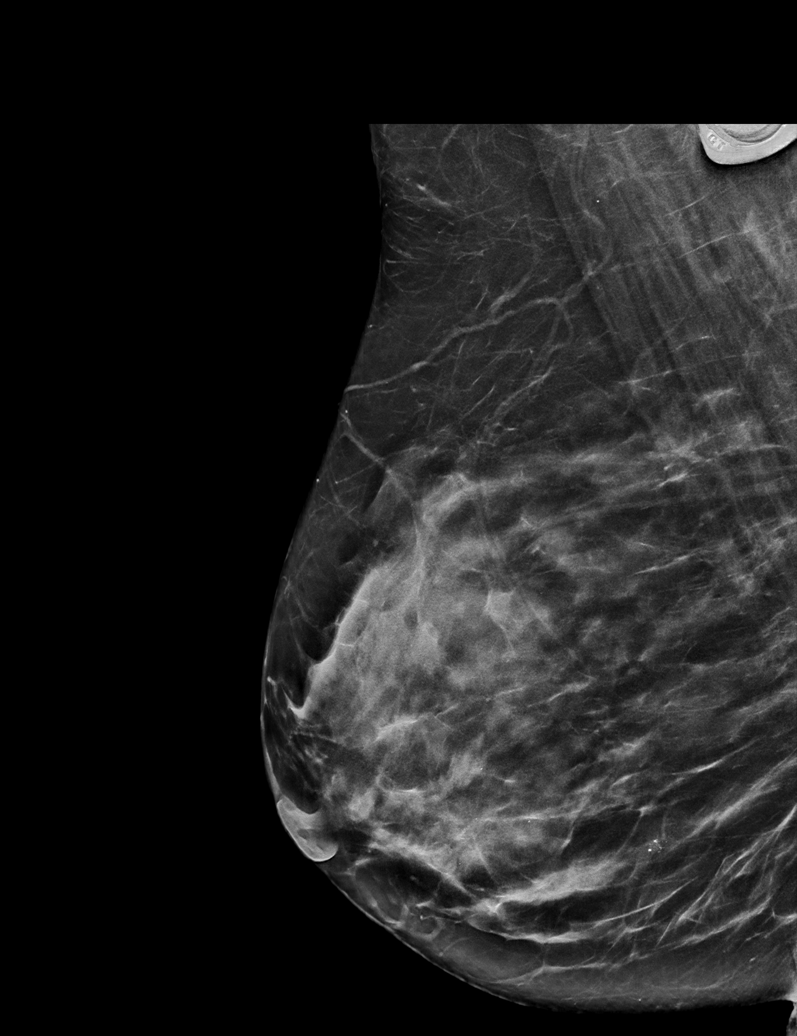

[R CC synth-2D]
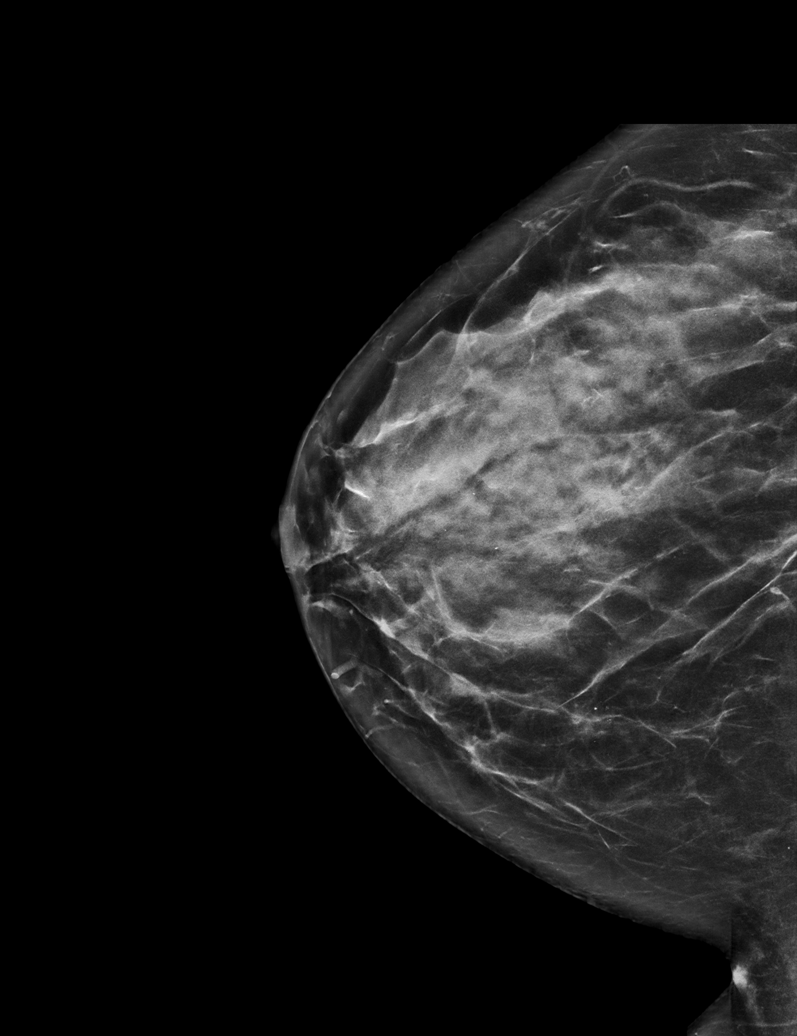

[R CC tomo · tomo slice 33/64.0]
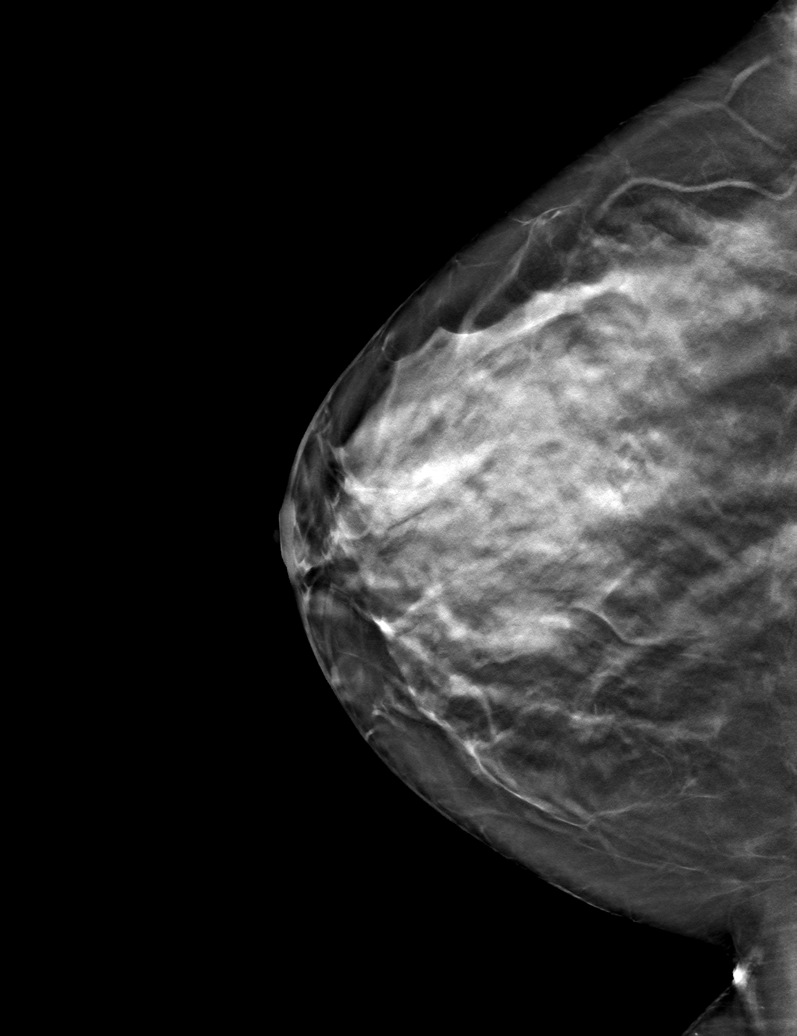

[R MLO tomo · tomo slice 35/68.0]
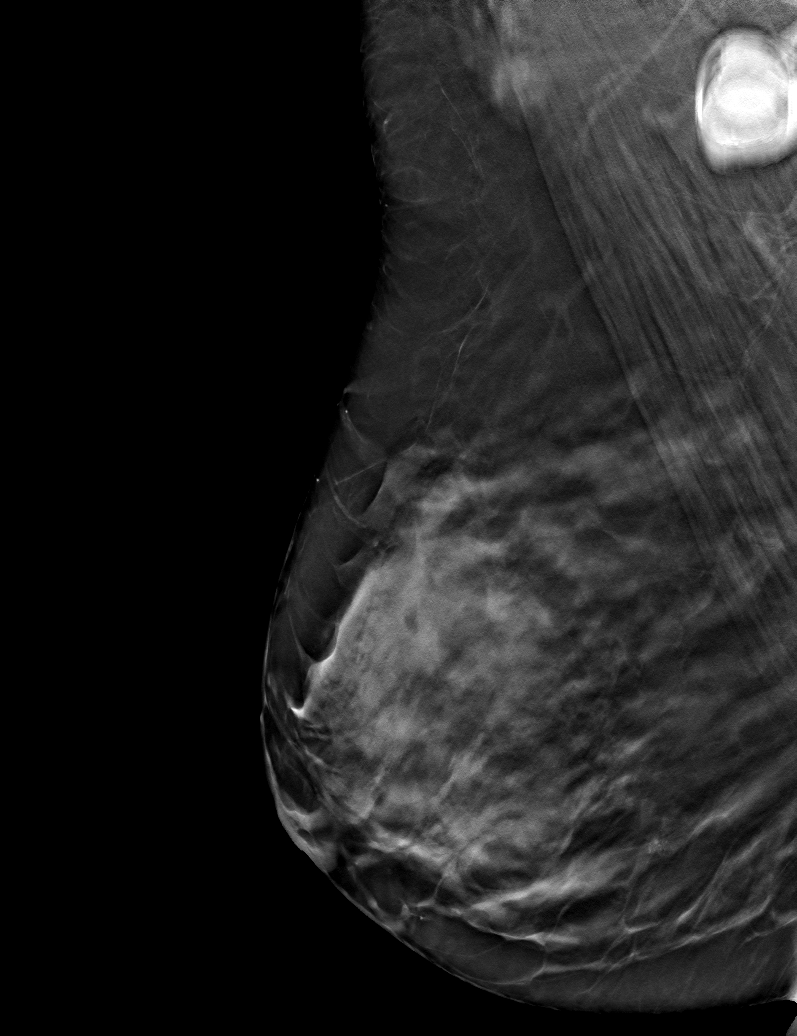

[6 of 14 positions shown; findings below may reference images not displayed]

ACR Breast Density Category c: The breast tissue is heterogeneously
dense, which may obscure small masses.
FINDINGS: Full field tomosynthesis and spot 2D magnification views of the
right breast were performed. There are grouped calcifications in the
medial inferior right breast which are unchanged compared to the
prior mammogram. No new suspicious linear or branching forms. No
associated mass or distortion.
IMPRESSION: Stable probably benign right breast calcifications.

RECOMMENDATION:
Diagnostic bilateral mammogram in 6 months.

I have discussed the findings and recommendations with the patient.
If applicable, a reminder letter will be sent to the patient
regarding the next appointment.

BI-RADS CATEGORY  3: Probably benign.
